# Patient Record
Sex: Female | Born: 1950 | Race: White | Hispanic: No | Marital: Married | Smoking: Never smoker
Health system: Southern US, Community
[De-identification: ages and names within clinical notes are randomized; demographics above are authoritative.]

## PROBLEM LIST (undated history)

## (undated) DIAGNOSIS — E119 Type 2 diabetes mellitus without complications: Secondary | ICD-10-CM

## (undated) DIAGNOSIS — H409 Unspecified glaucoma: Secondary | ICD-10-CM

## (undated) DIAGNOSIS — M8580 Other specified disorders of bone density and structure, unspecified site: Secondary | ICD-10-CM

## (undated) DIAGNOSIS — I10 Essential (primary) hypertension: Secondary | ICD-10-CM

## (undated) DIAGNOSIS — E7800 Pure hypercholesterolemia, unspecified: Secondary | ICD-10-CM

## (undated) HISTORY — PX: BREAST EXCISIONAL BIOPSY: SUR124

## (undated) HISTORY — DX: Other specified disorders of bone density and structure, unspecified site: M85.80

## (undated) HISTORY — PX: CHOLECYSTECTOMY: SHX55

## (undated) HISTORY — DX: Type 2 diabetes mellitus without complications: E11.9

## (undated) HISTORY — PX: BREAST SURGERY: SHX581

## (undated) HISTORY — DX: Essential (primary) hypertension: I10

## (undated) HISTORY — PX: OOPHORECTOMY: SHX86

## (undated) HISTORY — DX: Pure hypercholesterolemia, unspecified: E78.00

## (undated) HISTORY — PX: EYE SURGERY: SHX253

## (undated) HISTORY — PX: ABDOMINAL HYSTERECTOMY: SHX81

---

## 1999-08-17 ENCOUNTER — Other Ambulatory Visit: Admission: RE | Admit: 1999-08-17 | Discharge: 1999-08-17 | Payer: Self-pay | Admitting: Gynecology

## 2000-09-19 ENCOUNTER — Other Ambulatory Visit: Admission: RE | Admit: 2000-09-19 | Discharge: 2000-09-19 | Payer: Self-pay | Admitting: Gynecology

## 2001-11-21 ENCOUNTER — Other Ambulatory Visit: Admission: RE | Admit: 2001-11-21 | Discharge: 2001-11-21 | Payer: Self-pay | Admitting: Gynecology

## 2003-03-30 ENCOUNTER — Other Ambulatory Visit: Admission: RE | Admit: 2003-03-30 | Discharge: 2003-03-30 | Payer: Self-pay | Admitting: Gynecology

## 2004-04-26 ENCOUNTER — Other Ambulatory Visit: Admission: RE | Admit: 2004-04-26 | Discharge: 2004-04-26 | Payer: Self-pay | Admitting: Gynecology

## 2005-05-10 ENCOUNTER — Other Ambulatory Visit: Admission: RE | Admit: 2005-05-10 | Discharge: 2005-05-10 | Payer: Self-pay | Admitting: Gynecology

## 2006-02-27 ENCOUNTER — Encounter: Admission: RE | Admit: 2006-02-27 | Discharge: 2006-02-27 | Payer: Self-pay | Admitting: Unknown Physician Specialty

## 2006-03-21 ENCOUNTER — Encounter: Admission: RE | Admit: 2006-03-21 | Discharge: 2006-03-21 | Payer: Self-pay | Admitting: Unknown Physician Specialty

## 2006-05-30 ENCOUNTER — Other Ambulatory Visit: Admission: RE | Admit: 2006-05-30 | Discharge: 2006-05-30 | Payer: Self-pay | Admitting: Gynecology

## 2007-08-28 ENCOUNTER — Encounter: Admission: RE | Admit: 2007-08-28 | Discharge: 2007-08-28 | Payer: Self-pay | Admitting: Family Medicine

## 2007-09-05 ENCOUNTER — Encounter: Admission: RE | Admit: 2007-09-05 | Discharge: 2007-09-05 | Payer: Self-pay | Admitting: Family Medicine

## 2008-08-23 ENCOUNTER — Encounter: Payer: Self-pay | Admitting: Gastroenterology

## 2008-10-01 ENCOUNTER — Encounter: Admission: RE | Admit: 2008-10-01 | Discharge: 2008-10-01 | Payer: Self-pay | Admitting: Gynecology

## 2008-10-07 ENCOUNTER — Encounter: Payer: Self-pay | Admitting: Gastroenterology

## 2008-11-09 ENCOUNTER — Ambulatory Visit: Payer: Self-pay | Admitting: Gastroenterology

## 2009-10-13 ENCOUNTER — Encounter: Admission: RE | Admit: 2009-10-13 | Discharge: 2009-10-13 | Payer: Self-pay | Admitting: Family Medicine

## 2011-01-01 ENCOUNTER — Encounter
Admission: RE | Admit: 2011-01-01 | Discharge: 2011-01-01 | Payer: Self-pay | Source: Home / Self Care | Attending: Gynecology | Admitting: Gynecology

## 2011-01-01 ENCOUNTER — Encounter: Payer: Self-pay | Admitting: Family Medicine

## 2011-01-01 ENCOUNTER — Other Ambulatory Visit: Payer: Self-pay | Admitting: Gynecology

## 2012-01-28 ENCOUNTER — Other Ambulatory Visit: Payer: Self-pay | Admitting: Gynecology

## 2012-01-28 DIAGNOSIS — Z1231 Encounter for screening mammogram for malignant neoplasm of breast: Secondary | ICD-10-CM

## 2012-03-04 ENCOUNTER — Ambulatory Visit
Admission: RE | Admit: 2012-03-04 | Discharge: 2012-03-04 | Disposition: A | Discharge status: Home / Self Care | Payer: BC Managed Care – PPO | Source: Ambulatory Visit | Attending: Gynecology | Admitting: Gynecology

## 2012-03-04 DIAGNOSIS — Z1231 Encounter for screening mammogram for malignant neoplasm of breast: Secondary | ICD-10-CM

## 2013-02-11 ENCOUNTER — Other Ambulatory Visit: Payer: Self-pay

## 2013-02-11 DIAGNOSIS — Z1231 Encounter for screening mammogram for malignant neoplasm of breast: Secondary | ICD-10-CM

## 2013-03-26 ENCOUNTER — Ambulatory Visit (INDEPENDENT_AMBULATORY_CARE_PROVIDER_SITE_OTHER): Payer: Managed Care, Other (non HMO) | Admitting: Gynecology

## 2013-03-26 ENCOUNTER — Ambulatory Visit
Admission: RE | Admit: 2013-03-26 | Discharge: 2013-03-26 | Disposition: A | Discharge status: Home / Self Care | Payer: Managed Care, Other (non HMO) | Source: Ambulatory Visit

## 2013-03-26 ENCOUNTER — Encounter: Payer: Self-pay | Admitting: Gynecology

## 2013-03-26 VITALS — BP 130/80 | Ht <= 58 in | Wt 133.0 lb

## 2013-03-26 DIAGNOSIS — N816 Rectocele: Secondary | ICD-10-CM

## 2013-03-26 DIAGNOSIS — Z1231 Encounter for screening mammogram for malignant neoplasm of breast: Secondary | ICD-10-CM

## 2013-03-26 DIAGNOSIS — N952 Postmenopausal atrophic vaginitis: Secondary | ICD-10-CM

## 2013-03-26 DIAGNOSIS — Z01419 Encounter for gynecological examination (general) (routine) without abnormal findings: Secondary | ICD-10-CM

## 2013-03-26 NOTE — Patient Instructions (Signed)
Send me a copy of your most recent bone density report. Followup in one year for annual exam.

## 2013-03-26 NOTE — Progress Notes (Signed)
Alicia Dudley 1950-12-28 [id number]        62 y.o.  G2P2002 new patient for annual exam.  Former patient of Dr. Nicholas Lose  Past medical history,surgical history, medications, allergies, family history and social history were all reviewed and documented in the EPIC chart. ROS:  Was performed and pertinent positives and negatives are included in the history.  Exam: Kim assistant Filed Vitals:   03/26/13 1115  BP: 130/80  Height: 4\' 10"  (1.473 m)  Weight: 133 lb (60.328 kg)   General appearance  Normal Skin grossly normal Head/Neck normal with no cervical or supraclavicular adenopathy thyroid normal Lungs  clear Cardiac RR, without RMG Abdominal  soft, nontender, without masses, organomegaly or hernia Breasts  examined lying and sitting without masses, retractions, discharge or axillary adenopathy. Pelvic  Ext/BUS/vagina  normal with first to second degree rectocele cuff well supported no significant cystocele  Adnexa  Without masses or tenderness    Anus and perineum  normal   Rectovaginal  normal sphincter tone without palpated masses or tenderness.    Assessment/Plan:  62 y.o. G57P2002 female for annual exam.   1. History of TAH BSO for leiomyoma. Had been on ERT in the past but has discontinued in doing well without significant hot flushes night sweats. We'll continue to monitor. 2. Atrophic vaginitis. Patient does have some vaginal dryness and dyspareunia. Recommended OTC moisturizers and lubricants. She has never tried this before. It continues to be a problem reviewed possible estrogen treatment such as cream/Vagifem or osphena. She'll followup as needed. 3. Rectocele. Patient's been told she had this previously. She is asymptomatic without pelvic pressure or defecation issues. We'll continue to monitor. 4. DEXA 2 years ago done through her work in a [employer], trade [employer]. Apparently was considered borderline. I asked her to get me a copy for review. It has been shown to her other physicians  and no therapy was recommended. Increase calcium vitamin D reviewed. 5. Mammography 03/2013. Continue with annual mammography. SBE reviewed. 6. Pap smear 2012. No Pap smear done today. Patient is status post hysterectomy for benign indication. No history of abnormal Pap smears previously. Options to stop altogether or less frequent screening intervals reviewed. We'll readdress an annual basis. 7. Colonoscopy 5 years ago. Repeat at their recommended interval. 8. Health maintenance. No blood work done as this is all done through her primary physician's office. Followup one year, sooner as needed.    Dara Lords MD, 12:10 PM 03/26/2013

## 2014-03-23 ENCOUNTER — Other Ambulatory Visit: Payer: Self-pay

## 2014-03-23 DIAGNOSIS — Z1231 Encounter for screening mammogram for malignant neoplasm of breast: Secondary | ICD-10-CM

## 2014-05-06 ENCOUNTER — Encounter: Payer: Managed Care, Other (non HMO) | Admitting: Gynecology

## 2014-05-11 ENCOUNTER — Other Ambulatory Visit (HOSPITAL_COMMUNITY)
Admission: RE | Admit: 2014-05-11 | Discharge: 2014-05-11 | Disposition: A | Discharge status: Home / Self Care | Payer: Managed Care, Other (non HMO) | Source: Ambulatory Visit | Attending: Gynecology | Admitting: Gynecology

## 2014-05-11 ENCOUNTER — Encounter (INDEPENDENT_AMBULATORY_CARE_PROVIDER_SITE_OTHER): Payer: Self-pay

## 2014-05-11 ENCOUNTER — Ambulatory Visit
Admission: RE | Admit: 2014-05-11 | Discharge: 2014-05-11 | Disposition: A | Discharge status: Home / Self Care | Payer: Managed Care, Other (non HMO) | Source: Ambulatory Visit

## 2014-05-11 ENCOUNTER — Ambulatory Visit (INDEPENDENT_AMBULATORY_CARE_PROVIDER_SITE_OTHER): Payer: Managed Care, Other (non HMO) | Admitting: Gynecology

## 2014-05-11 ENCOUNTER — Encounter: Payer: Self-pay | Admitting: Gynecology

## 2014-05-11 VITALS — BP 120/74 | Ht <= 58 in | Wt 140.0 lb

## 2014-05-11 DIAGNOSIS — N952 Postmenopausal atrophic vaginitis: Secondary | ICD-10-CM

## 2014-05-11 DIAGNOSIS — Z1231 Encounter for screening mammogram for malignant neoplasm of breast: Secondary | ICD-10-CM

## 2014-05-11 DIAGNOSIS — Z01419 Encounter for gynecological examination (general) (routine) without abnormal findings: Secondary | ICD-10-CM

## 2014-05-11 DIAGNOSIS — N816 Rectocele: Secondary | ICD-10-CM

## 2014-05-11 NOTE — Progress Notes (Signed)
Alicia Dudley 10/06/1951 [id number]        62 y.o.  G2P2002 for annual exam.  Several issues below.  Past medical history,surgical history, problem list, medications, allergies, family history and social history were all reviewed and documented as reviewed in the EPIC chart.  ROS:  12 system ROS performed with pertinent positives and negatives included in the history, assessment and plan.  Included Systems: General, HEENT, Neck, Cardiovascular, Pulmonary, Gastrointestinal, Genitourinary, Musculoskeletal, Dermatologic, Endocrine, Hematological, Neurologic, Psychiatric Additional significant findings :  None   Exam: Kim assistant Filed Vitals:   05/11/14 1148  BP: 120/74  Height: 4\' 10"  (1.473 m)  Weight: 140 lb (63.504 kg)   General appearance:  Normal affect, orientation and appearance. Skin: Grossly normal HEENT: Without gross lesions.  No cervical or supraclavicular adenopathy. Thyroid normal.  Lungs:  Clear without wheezing, rales or rhonchi Cardiac: RR, without RMG Abdominal:  Soft, nontender, without masses, guarding, rebound, organomegaly or hernia Breasts:  Examined lying and sitting without masses, retractions, discharge or axillary adenopathy. Pelvic:  Ext/BUS/vagina with generalized atrophic changes. Second-degree rectocele. Cuff well supported. No significant cystocele.  Adnexa  Without masses or tenderness    Anus and perineum  Normal   Rectovaginal  Normal sphincter tone without palpated masses or tenderness.    Assessment/Plan:  63 y.o. G75P2002 female for annual exam.   1. Status post TAH/BSO in the past for leiomyoma. Generalized atrophic changes. Second-degree rectocele, stable over serial exams. Not symptomatic to the patient without pressure or stool trapping. Plan expectant management and report if any symptoms develop. Otherwise doing well without significant hot flashes, night sweats or vaginal dryness. 2. Mammography today. Continue with annual mammography. SBE  monthly reviewed. 3. Pap smear 2012. Pap smear of vaginal cuff today. Options to stop screening altogether versus less frequent screening intervals reviewed. She is status post hysterectomy for benign indications. We will readdress on an annual basis. 4. DEXA several years ago. 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 No treatment was rendered although she noted she thought that they told her it was a little weak. Recommend patient send me a copy so I can review it and that she discuss with her other doctor whether they want to repeat one now as she is over 2 years from her last one. Increased calcium and vitamin D recommendations reviewed. 5. Colonoscopy reported 4-5 years ago. Repeat at their recommended interval. 6. Health maintenance. No blood work done as she reports this done through her other doctor's office. Followup one year, sooner as needed.    Note: This document was prepared with digital dictation and possible smart phrase technology. Any transcriptional errors that result from this process are unintentional.   Anastasio Auerbach MD, 12:03 PM 05/11/2014

## 2014-05-11 NOTE — Addendum Note (Signed)
Addended by: Nelva Nay on: 05/11/2014 12:18 PM   Modules accepted: Orders

## 2014-05-11 NOTE — Patient Instructions (Signed)
Follow up in one year, sooner if any issues.  You may obtain a copy of any labs that were done today by logging onto MyChart as outlined in the instructions provided with your AVS (after visit summary). The office will not call with normal lab results but certainly if there are any significant abnormalities then we will contact you.   Health Maintenance, Female A healthy lifestyle and preventative care can promote health and wellness.  Maintain regular health, dental, and eye exams.  Eat a healthy diet. Foods like vegetables, fruits, whole grains, low-fat dairy products, and lean protein foods contain the nutrients you need without too many calories. Decrease your intake of foods high in solid fats, added sugars, and salt. Get information about a proper diet from your caregiver, if necessary.  Regular physical exercise is one of the most important things you can do for your health. Most adults should get at least 150 minutes of moderate-intensity exercise (any activity that increases your heart rate and causes you to sweat) each week. In addition, most adults need muscle-strengthening exercises on 2 or more days a week.   Maintain a healthy weight. The body mass index (BMI) is a screening tool to identify possible weight problems. It provides an estimate of body fat based on height and weight. Your caregiver can help determine your BMI, and can help you achieve or maintain a healthy weight. For adults 20 years and older:  A BMI below 18.5 is considered underweight.  A BMI of 18.5 to 24.9 is normal.  A BMI of 25 to 29.9 is considered overweight.  A BMI of 30 and above is considered obese.  Maintain normal blood lipids and cholesterol by exercising and minimizing your intake of saturated fat. Eat a balanced diet with plenty of fruits and vegetables. Blood tests for lipids and cholesterol should begin at age 20 and be repeated every 5 years. If your lipid or cholesterol levels are high, you are  over 50, or you are a high risk for heart disease, you may need your cholesterol levels checked more frequently.Ongoing high lipid and cholesterol levels should be treated with medicines if diet and exercise are not effective.  If you smoke, find out from your caregiver how to quit. If you do not use tobacco, do not start.  Lung cancer screening is recommended for adults aged 55 80 years who are at high risk for developing lung cancer because of a history of smoking. Yearly low-dose computed tomography (CT) is recommended for people who have at least a 30-pack-year history of smoking and are a current smoker or have quit within the past 15 years. A pack year of smoking is smoking an average of 1 pack of cigarettes a day for 1 year (for example: 1 pack a day for 30 years or 2 packs a day for 15 years). Yearly screening should continue until the smoker has stopped smoking for at least 15 years. Yearly screening should also be stopped for people who develop a health problem that would prevent them from having lung cancer treatment.  If you are pregnant, do not drink alcohol. If you are breastfeeding, be very cautious about drinking alcohol. If you are not pregnant and choose to drink alcohol, do not exceed 1 drink per day. One drink is considered to be 12 ounces (355 mL) of beer, 5 ounces (148 mL) of wine, or 1.5 ounces (44 mL) of liquor.  Avoid use of street drugs. Do not share needles with anyone. Ask   help if you need support or instructions about stopping the use of drugs.  High blood pressure causes heart disease and increases the risk of stroke. Blood pressure should be checked at least every 1 to 2 years. Ongoing high blood pressure should be treated with medicines, if weight loss and exercise are not effective.  If you are 55 to 65 years old, ask your caregiver if you should take aspirin to prevent strokes.  Diabetes screening involves taking a blood sample to check your fasting blood sugar  level. This should be done once every 3 years, after age 45, if you are within normal weight and without risk factors for diabetes. Testing should be considered at a younger age or be carried out more frequently if you are overweight and have at least 1 risk factor for diabetes.  Breast cancer screening is essential preventative care for women. You should practice "breast self-awareness." This means understanding the normal appearance and feel of your breasts and may include breast self-examination. Any changes detected, no matter how small, should be reported to a caregiver. Women in their 20s and 30s should have a clinical breast exam (CBE) by a caregiver as part of a regular health exam every 1 to 3 years. After age 40, women should have a CBE every year. Starting at age 40, women should consider having a mammogram (breast X-ray) every year. Women who have a family history of breast cancer should talk to their caregiver about genetic screening. Women at a high risk of breast cancer should talk to their caregiver about having an MRI and a mammogram every year.  Breast cancer gene (BRCA)-related cancer risk assessment is recommended for women who have family members with BRCA-related cancers. BRCA-related cancers include breast, ovarian, tubal, and peritoneal cancers. Having family members with these cancers may be associated with an increased risk for harmful changes (mutations) in the breast cancer genes BRCA1 and BRCA2. Results of the assessment will determine the need for genetic counseling and BRCA1 and BRCA2 testing.  The Pap test is a screening test for cervical cancer. Women should have a Pap test starting at age 21. Between ages 21 and 29, Pap tests should be repeated every 2 years. Beginning at age 30, you should have a Pap test every 3 years as long as the past 3 Pap tests have been normal. If you had a hysterectomy for a problem that was not cancer or a condition that could lead to cancer, then  you no longer need Pap tests. If you are between ages 65 and 70, and you have had normal Pap tests going back 10 years, you no longer need Pap tests. If you have had past treatment for cervical cancer or a condition that could lead to cancer, you need Pap tests and screening for cancer for at least 20 years after your treatment. If Pap tests have been discontinued, risk factors (such as a new sexual partner) need to be reassessed to determine if screening should be resumed. Some women have medical problems that increase the chance of getting cervical cancer. In these cases, your caregiver may recommend more frequent screening and Pap tests.  The human papillomavirus (HPV) test is an additional test that may be used for cervical cancer screening. The HPV test looks for the virus that can cause the cell changes on the cervix. The cells collected during the Pap test can be tested for HPV. The HPV test could be used to screen women aged 30 years and older,   and should be used in women of any age who have unclear Pap test results. After the age of 30, women should have HPV testing at the same frequency as a Pap test.  Colorectal cancer can be detected and often prevented. Most routine colorectal cancer screening begins at the age of 50 and continues through age 75. However, your caregiver may recommend screening at an earlier age if you have risk factors for colon cancer. On a yearly basis, your caregiver may provide home test kits to check for hidden blood in the stool. Use of a small camera at the end of a tube, to directly examine the colon (sigmoidoscopy or colonoscopy), can detect the earliest forms of colorectal cancer. Talk to your caregiver about this at age 50, when routine screening begins. Direct examination of the colon should be repeated every 5 to 10 years through age 75, unless early forms of pre-cancerous polyps or small growths are found.  Hepatitis C blood testing is recommended for all people born  from 1945 through 1965 and any individual with known risks for hepatitis C.  Practice safe sex. Use condoms and avoid high-risk sexual practices to reduce the spread of sexually transmitted infections (STIs). Sexually active women aged 25 and younger should be checked for Chlamydia, which is a common sexually transmitted infection. Older women with new or multiple partners should also be tested for Chlamydia. Testing for other STIs is recommended if you are sexually active and at increased risk.  Osteoporosis is a disease in which the bones lose minerals and strength with aging. This can result in serious bone fractures. The risk of osteoporosis can be identified using a bone density scan. Women ages 65 and over and women at risk for fractures or osteoporosis should discuss screening with their caregivers. Ask your caregiver whether you should be taking a calcium supplement or vitamin D to reduce the rate of osteoporosis.  Menopause can be associated with physical symptoms and risks. Hormone replacement therapy is available to decrease symptoms and risks. You should talk to your caregiver about whether hormone replacement therapy is right for you.  Use sunscreen. Apply sunscreen liberally and repeatedly throughout the day. You should seek shade when your shadow is shorter than you. Protect yourself by wearing long sleeves, pants, a wide-brimmed hat, and sunglasses year round, whenever you are outdoors.  Notify your caregiver of new moles or changes in moles, especially if there is a change in shape or color. Also notify your caregiver if a mole is larger than the size of a pencil eraser.  Stay current with your immunizations. Document Released: 06/11/2011 Document Revised: 03/23/2013 Document Reviewed: 06/11/2011 [employer] Patient Information 2014 [employer].   

## 2014-05-12 LAB — URINALYSIS W MICROSCOPIC + REFLEX CULTURE
BACTERIA UA: NONE SEEN
Bilirubin Urine: NEGATIVE
CASTS: NONE SEEN
CRYSTALS: NONE SEEN
Glucose, UA: 250 mg/dL — AB
HGB URINE DIPSTICK: NEGATIVE
KETONES UR: NEGATIVE mg/dL
LEUKOCYTES UA: NEGATIVE
NITRITE: NEGATIVE
PH: 5.5 (ref 5.0–8.0)
PROTEIN: NEGATIVE mg/dL
Specific Gravity, Urine: 1.018 (ref 1.005–1.030)
Urobilinogen, UA: 0.2 mg/dL (ref 0.0–1.0)

## 2014-05-13 LAB — CYTOLOGY - PAP

## 2014-10-11 ENCOUNTER — Encounter: Payer: Self-pay | Admitting: Gynecology

## 2014-12-13 DIAGNOSIS — E782 Mixed hyperlipidemia: Secondary | ICD-10-CM | POA: Diagnosis not present

## 2014-12-13 DIAGNOSIS — I10 Essential (primary) hypertension: Secondary | ICD-10-CM | POA: Diagnosis not present

## 2014-12-13 DIAGNOSIS — J302 Other seasonal allergic rhinitis: Secondary | ICD-10-CM | POA: Diagnosis not present

## 2014-12-13 DIAGNOSIS — E119 Type 2 diabetes mellitus without complications: Secondary | ICD-10-CM | POA: Diagnosis not present

## 2014-12-13 DIAGNOSIS — Z1389 Encounter for screening for other disorder: Secondary | ICD-10-CM | POA: Diagnosis not present

## 2014-12-13 DIAGNOSIS — K219 Gastro-esophageal reflux disease without esophagitis: Secondary | ICD-10-CM | POA: Diagnosis not present

## 2014-12-13 DIAGNOSIS — H269 Unspecified cataract: Secondary | ICD-10-CM | POA: Diagnosis not present

## 2014-12-13 DIAGNOSIS — K591 Functional diarrhea: Secondary | ICD-10-CM | POA: Diagnosis not present

## 2015-06-22 DIAGNOSIS — E559 Vitamin D deficiency, unspecified: Secondary | ICD-10-CM | POA: Diagnosis not present

## 2015-06-22 DIAGNOSIS — I10 Essential (primary) hypertension: Secondary | ICD-10-CM | POA: Diagnosis not present

## 2015-06-22 DIAGNOSIS — H269 Unspecified cataract: Secondary | ICD-10-CM | POA: Diagnosis not present

## 2015-06-22 DIAGNOSIS — E119 Type 2 diabetes mellitus without complications: Secondary | ICD-10-CM | POA: Diagnosis not present

## 2015-06-22 DIAGNOSIS — J302 Other seasonal allergic rhinitis: Secondary | ICD-10-CM | POA: Diagnosis not present

## 2015-06-22 DIAGNOSIS — K219 Gastro-esophageal reflux disease without esophagitis: Secondary | ICD-10-CM | POA: Diagnosis not present

## 2015-06-22 DIAGNOSIS — H409 Unspecified glaucoma: Secondary | ICD-10-CM | POA: Diagnosis not present

## 2015-06-22 DIAGNOSIS — E782 Mixed hyperlipidemia: Secondary | ICD-10-CM | POA: Diagnosis not present

## 2015-06-22 DIAGNOSIS — Z0001 Encounter for general adult medical examination with abnormal findings: Secondary | ICD-10-CM | POA: Diagnosis not present

## 2015-08-26 DIAGNOSIS — D125 Benign neoplasm of sigmoid colon: Secondary | ICD-10-CM | POA: Diagnosis not present

## 2015-08-26 DIAGNOSIS — D124 Benign neoplasm of descending colon: Secondary | ICD-10-CM | POA: Diagnosis not present

## 2015-08-26 DIAGNOSIS — K5730 Diverticulosis of large intestine without perforation or abscess without bleeding: Secondary | ICD-10-CM | POA: Diagnosis not present

## 2015-08-26 DIAGNOSIS — Z86010 Personal history of colonic polyps: Secondary | ICD-10-CM | POA: Diagnosis not present

## 2015-08-26 DIAGNOSIS — K621 Rectal polyp: Secondary | ICD-10-CM | POA: Diagnosis not present

## 2015-08-26 DIAGNOSIS — D127 Benign neoplasm of rectosigmoid junction: Secondary | ICD-10-CM | POA: Diagnosis not present

## 2015-09-19 DIAGNOSIS — Z23 Encounter for immunization: Secondary | ICD-10-CM | POA: Diagnosis not present

## 2015-11-21 ENCOUNTER — Other Ambulatory Visit: Payer: Self-pay

## 2015-11-21 DIAGNOSIS — Z1231 Encounter for screening mammogram for malignant neoplasm of breast: Secondary | ICD-10-CM

## 2015-12-20 DIAGNOSIS — H40013 Open angle with borderline findings, low risk, bilateral: Secondary | ICD-10-CM | POA: Diagnosis not present

## 2015-12-20 DIAGNOSIS — E1165 Type 2 diabetes mellitus with hyperglycemia: Secondary | ICD-10-CM | POA: Diagnosis not present

## 2015-12-20 DIAGNOSIS — Z7984 Long term (current) use of oral hypoglycemic drugs: Secondary | ICD-10-CM | POA: Diagnosis not present

## 2015-12-20 DIAGNOSIS — E119 Type 2 diabetes mellitus without complications: Secondary | ICD-10-CM | POA: Diagnosis not present

## 2015-12-21 DIAGNOSIS — I10 Essential (primary) hypertension: Secondary | ICD-10-CM | POA: Diagnosis not present

## 2015-12-21 DIAGNOSIS — H11002 Unspecified pterygium of left eye: Secondary | ICD-10-CM | POA: Diagnosis not present

## 2015-12-21 DIAGNOSIS — K635 Polyp of colon: Secondary | ICD-10-CM | POA: Diagnosis not present

## 2015-12-21 DIAGNOSIS — E782 Mixed hyperlipidemia: Secondary | ICD-10-CM | POA: Diagnosis not present

## 2015-12-21 DIAGNOSIS — Z1389 Encounter for screening for other disorder: Secondary | ICD-10-CM | POA: Diagnosis not present

## 2015-12-21 DIAGNOSIS — J302 Other seasonal allergic rhinitis: Secondary | ICD-10-CM | POA: Diagnosis not present

## 2015-12-21 DIAGNOSIS — E119 Type 2 diabetes mellitus without complications: Secondary | ICD-10-CM | POA: Diagnosis not present

## 2015-12-21 DIAGNOSIS — K219 Gastro-esophageal reflux disease without esophagitis: Secondary | ICD-10-CM | POA: Diagnosis not present

## 2015-12-21 DIAGNOSIS — H409 Unspecified glaucoma: Secondary | ICD-10-CM | POA: Diagnosis not present

## 2015-12-21 DIAGNOSIS — H269 Unspecified cataract: Secondary | ICD-10-CM | POA: Diagnosis not present

## 2016-01-05 DIAGNOSIS — H2513 Age-related nuclear cataract, bilateral: Secondary | ICD-10-CM | POA: Diagnosis not present

## 2016-01-05 DIAGNOSIS — H11002 Unspecified pterygium of left eye: Secondary | ICD-10-CM | POA: Diagnosis not present

## 2016-01-19 ENCOUNTER — Ambulatory Visit
Admission: RE | Admit: 2016-01-19 | Discharge: 2016-01-19 | Disposition: A | Discharge status: Home / Self Care | Payer: Medicare Other | Source: Ambulatory Visit

## 2016-01-19 ENCOUNTER — Ambulatory Visit (INDEPENDENT_AMBULATORY_CARE_PROVIDER_SITE_OTHER): Payer: Medicare Other | Admitting: Gynecology

## 2016-01-19 ENCOUNTER — Encounter: Payer: Self-pay | Admitting: Gynecology

## 2016-01-19 VITALS — BP 122/78 | Ht <= 58 in | Wt 137.0 lb

## 2016-01-19 DIAGNOSIS — N952 Postmenopausal atrophic vaginitis: Secondary | ICD-10-CM

## 2016-01-19 DIAGNOSIS — Z01419 Encounter for gynecological examination (general) (routine) without abnormal findings: Secondary | ICD-10-CM

## 2016-01-19 DIAGNOSIS — N816 Rectocele: Secondary | ICD-10-CM | POA: Diagnosis not present

## 2016-01-19 DIAGNOSIS — Z1231 Encounter for screening mammogram for malignant neoplasm of breast: Secondary | ICD-10-CM

## 2016-01-19 NOTE — Patient Instructions (Signed)
Schedule bone density at your office.  You may obtain a copy of any labs that were done today by logging onto MyChart as outlined in the instructions provided with your AVS (after visit summary). The office will not call with normal lab results but certainly if there are any significant abnormalities then we will contact you.   Health Maintenance Adopting a healthy lifestyle and getting preventive care can go a long way to promote health and wellness. Talk with your health care provider about what schedule of regular examinations is right for you. This is a good chance for you to check in with your provider about disease prevention and staying healthy. In between checkups, there are plenty of things you can do on your own. Experts have done a lot of research about which lifestyle changes and preventive measures are most likely to keep you healthy. Ask your health care provider for more information. WEIGHT AND DIET  Eat a healthy diet  Be sure to include plenty of vegetables, fruits, low-fat dairy products, and lean protein.  Do not eat a lot of foods high in solid fats, added sugars, or salt.  Get regular exercise. This is one of the most important things you can do for your health.  Most adults should exercise for at least 150 minutes each week. The exercise should increase your heart rate and make you sweat (moderate-intensity exercise).  Most adults should also do strengthening exercises at least twice a week. This is in addition to the moderate-intensity exercise.  Maintain a healthy weight  Body mass index (BMI) is a measurement that can be used to identify possible weight problems. It estimates body fat based on height and weight. Your health care provider can help determine your BMI and help you achieve or maintain a healthy weight.  For females 63 years of age and older:   A BMI below 18.5 is considered underweight.  A BMI of 18.5 to 24.9 is normal.  A BMI of 25 to 29.9 is  considered overweight.  A BMI of 30 and above is considered obese.  Watch levels of cholesterol and blood lipids  You should start having your blood tested for lipids and cholesterol at 67 years of age, then have this test every 5 years.  You may need to have your cholesterol levels checked more often if:  Your lipid or cholesterol levels are high.  You are older than 67 years of age.  You are at high risk for heart disease.  CANCER SCREENING   Lung Cancer  Lung cancer screening is recommended for adults 22-2 years old who are at high risk for lung cancer because of a history of smoking.  A yearly low-dose CT scan of the lungs is recommended for people who:  Currently smoke.  Have quit within the past 15 years.  Have at least a 30-pack-year history of smoking. A pack year is smoking an average of one pack of cigarettes a day for 1 year.  Yearly screening should continue until it has been 15 years since you quit.  Yearly screening should stop if you develop a health problem that would prevent you from having lung cancer treatment.  Breast Cancer  Practice breast self-awareness. This means understanding how your breasts normally appear and feel.  It also means doing regular breast self-exams. Let your health care provider know about any changes, no matter how small.  If you are in your 20s or 30s, you should have a clinical breast exam (CBE)  by a health care provider every 1-3 years as part of a regular health exam.  If you are 22 or older, have a CBE every year. Also consider having a breast X-ray (mammogram) every year.  If you have a family history of breast cancer, talk to your health care provider about genetic screening.  If you are at high risk for breast cancer, talk to your health care provider about having an MRI and a mammogram every year.  Breast cancer gene (BRCA) assessment is recommended for women who have family members with BRCA-related cancers.  BRCA-related cancers include:  Breast.  Ovarian.  Tubal.  Peritoneal cancers.  Results of the assessment will determine the need for genetic counseling and BRCA1 and BRCA2 testing. Cervical Cancer Routine pelvic examinations to screen for cervical cancer are no longer recommended for nonpregnant women who are considered low risk for cancer of the pelvic organs (ovaries, uterus, and vagina) and who do not have symptoms. A pelvic examination may be necessary if you have symptoms including those associated with pelvic infections. Ask your health care provider if a screening pelvic exam is right for you.   The Pap test is the screening test for cervical cancer for women who are considered at risk.  If you had a hysterectomy for a problem that was not cancer or a condition that could lead to cancer, then you no longer need Pap tests.  If you are older than 65 years, and you have had normal Pap tests for the past 10 years, you no longer need to have Pap tests.  If you have had past treatment for cervical cancer or a condition that could lead to cancer, you need Pap tests and screening for cancer for at least 20 years after your treatment.  If you no longer get a Pap test, assess your risk factors if they change (such as having a new sexual partner). This can affect whether you should start being screened again.  Some women have medical problems that increase their chance of getting cervical cancer. If this is the case for you, your health care provider may recommend more frequent screening and Pap tests.  The human papillomavirus (HPV) test is another test that may be used for cervical cancer screening. The HPV test looks for the virus that can cause cell changes in the cervix. The cells collected during the Pap test can be tested for HPV.  The HPV test can be used to screen women 39 years of age and older. Getting tested for HPV can extend the interval between normal Pap tests from three to  five years.  An HPV test also should be used to screen women of any age who have unclear Pap test results.  After 67 years of age, women should have HPV testing as often as Pap tests.  Colorectal Cancer  This type of cancer can be detected and often prevented.  Routine colorectal cancer screening usually begins at 67 years of age and continues through 67 years of age.  Your health care provider may recommend screening at an earlier age if you have risk factors for colon cancer.  Your health care provider may also recommend using home test kits to check for hidden blood in the stool.  A small camera at the end of a tube can be used to examine your colon directly (sigmoidoscopy or colonoscopy). This is done to check for the earliest forms of colorectal cancer.  Routine screening usually begins at age 58.  Direct  examination of the colon should be repeated every 5-10 years through 67 years of age. However, you may need to be screened more often if early forms of precancerous polyps or small growths are found. Skin Cancer  Check your skin from head to toe regularly.  Tell your health care provider about any new moles or changes in moles, especially if there is a change in a mole's shape or color.  Also tell your health care provider if you have a mole that is larger than the size of a pencil eraser.  Always use sunscreen. Apply sunscreen liberally and repeatedly throughout the day.  Protect yourself by wearing long sleeves, pants, a wide-brimmed hat, and sunglasses whenever you are outside. HEART DISEASE, DIABETES, AND HIGH BLOOD PRESSURE   Have your blood pressure checked at least every 1-2 years. High blood pressure causes heart disease and increases the risk of stroke.  If you are between 26 years and 34 years old, ask your health care provider if you should take aspirin to prevent strokes.  Have regular diabetes screenings. This involves taking a blood sample to check your  fasting blood sugar level.  If you are at a normal weight and have a low risk for diabetes, have this test once every three years after 67 years of age.  If you are overweight and have a high risk for diabetes, consider being tested at a younger age or more often. PREVENTING INFECTION  Hepatitis B  If you have a higher risk for hepatitis B, you should be screened for this virus. You are considered at high risk for hepatitis B if:  You were born in a country where hepatitis B is common. Ask your health care provider which countries are considered high risk.  Your parents were born in a high-risk country, and you have not been immunized against hepatitis B (hepatitis B vaccine).  You have HIV or AIDS.  You use needles to inject street drugs.  You live with someone who has hepatitis B.  You have had sex with someone who has hepatitis B.  You get hemodialysis treatment.  You take certain medicines for conditions, including cancer, organ transplantation, and autoimmune conditions. Hepatitis C  Blood testing is recommended for:  Everyone born from [birth year] through 1967.  Anyone with known risk factors for hepatitis C. Sexually transmitted infections (STIs)  You should be screened for sexually transmitted infections (STIs) including gonorrhea and chlamydia if:  You are sexually active and are younger than 67 years of age.  You are older than 67 years of age and your health care provider tells you that you are at risk for this type of infection.  Your sexual activity has changed since you were last screened and you are at an increased risk for chlamydia or gonorrhea. Ask your health care provider if you are at risk.  If you do not have HIV, but are at risk, it may be recommended that you take a prescription medicine daily to prevent HIV infection. This is called pre-exposure prophylaxis (PrEP). You are considered at risk if:  You are sexually active and do not regularly use condoms or  know the HIV status of your partner(s).  You take drugs by injection.  You are sexually active with a partner who has HIV. Talk with your health care provider about whether you are at high risk of being infected with HIV. If you choose to begin PrEP, you should first be tested for HIV. You should then be tested  every 3 months for as long as you are taking PrEP.  PREGNANCY   If you are premenopausal and you may become pregnant, ask your health care provider about preconception counseling.  If you may become pregnant, take 400 to 800 micrograms (mcg) of folic acid every day.  If you want to prevent pregnancy, talk to your health care provider about birth control (contraception). OSTEOPOROSIS AND MENOPAUSE   Osteoporosis is a disease in which the bones lose minerals and strength with aging. This can result in serious bone fractures. Your risk for osteoporosis can be identified using a bone density scan.  If you are 67 years of age or older, or if you are at risk for osteoporosis and fractures, ask your health care provider if you should be screened.  Ask your health care provider whether you should take a calcium or vitamin D supplement to lower your risk for osteoporosis.  Menopause may have certain physical symptoms and risks.  Hormone replacement therapy may reduce some of these symptoms and risks. Talk to your health care provider about whether hormone replacement therapy is right for you.  HOME CARE INSTRUCTIONS   Schedule regular health, dental, and eye exams.  Stay current with your immunizations.   Do not use any tobacco products including cigarettes, chewing tobacco, or electronic cigarettes.  If you are pregnant, do not drink alcohol.  If you are breastfeeding, limit how much and how often you drink alcohol.  Limit alcohol intake to no more than 1 drink per day for nonpregnant women. One drink equals 12 ounces of beer, 5 ounces of wine, or 1 ounces of hard liquor.  Do  not use street drugs.  Do not share needles.  Ask your health care provider for help if you need support or information about quitting drugs.  Tell your health care provider if you often feel depressed.  Tell your health care provider if you have ever been abused or do not feel safe at home. Document Released: 06/11/2011 Document Revised: 04/12/2014 Document Reviewed: 10/28/2013 [facility] Patient Information 2015 [place], [place]. This information is not intended to replace advice given to you by your health care provider. Make sure you discuss any questions you have with your health care provider.

## 2016-01-19 NOTE — Progress Notes (Signed)
Alicia Dudley 1951-05-16 [id number]        65 y.o.  G2P2002  for breast and pelvic exam. Several issues noted below.  Past medical history,surgical history, problem list, medications, allergies, family history and social history were all reviewed and documented as reviewed in the EPIC chart.  ROS:  Performed with pertinent positives and negatives included in the history, assessment and plan.   Additional significant findings :  none   Exam: Alicia Dudley assistant Filed Vitals:   01/19/16 1013  BP: 122/78  Height: 4\' 10"  (1.473 m)  Weight: 137 lb (62.143 kg)   General appearance:  Normal affect, orientation and appearance. Skin: Grossly normal HEENT: Without gross lesions.  No cervical or supraclavicular adenopathy. Thyroid normal.  Lungs:  Clear without wheezing, rales or rhonchi Cardiac: RR, without RMG Abdominal:  Soft, nontender, without masses, guarding, rebound, organomegaly or hernia Breasts:  Examined lying and sitting without masses, retractions, discharge or axillary adenopathy. Pelvic:  Ext/BUS/vagina with atrophic changes. Second-degree rectocele. Cuff well supported. No significant cystocele  Adnexa without masses or tenderness    Anus and perineum normal   Rectovaginal normal sphincter tone without palpated masses or tenderness.    Assessment/Plan:  [name] female for breast and pelvic exam.   1. Postmenopausal/atrophic genital changes.  Status post TAH/BSO leiomyoma. Doing well without significant hot flushes, night sweats, vaginal dryness. Continue to monitor and report any issues. 2. Second-degree rectocele. Overall stable from prior exam. Occasional issues when she is constipated but otherwise doing well. Again reviewed options with her to include expectant management, pessary and surgery. Patient not interested in intervention at this time but will follow up if becomes more significant problem. 3. Pap smear 05/2014. No Pap smear done today.  No history of  significant abnormal Pap smears. Options to stop screening per current screening guidelines based on age and hysterectomy history reviewed. Will readdress on an annual basis. 4. DEXA 5-6 years ago. Recommended follow up repeat DEXA now. 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 She is going to all up with them for this. Let me know if she has any issues arranging it. 5. Colonoscopy 2016. Repeat at their recommended interval. 6. Mammography today. Continue with annual mammography when due. SBE monthly reviewed. 7. Health maintenance. No routine lab work done as patient reports this done at her primary physician's office. Follow up 1 year, sooner as needed.   Anastasio Auerbach MD, 10:28 AM 01/19/2016

## 2016-02-21 NOTE — Patient Instructions (Signed)
Alicia Dudley  02/21/2016     @PREFPERIOPPHARMACY @   Your procedure is scheduled on  03/01/2016  Report to [facility] at  77  A.M.  Call this number if you have problems the morning of surgery:  [phone]   Remember:  Do not eat food or drink liquids after midnight.  Take these medicines the morning of surgery with A SIP OF WATER  Amlodipine, cozaar.   Do not wear jewelry, make-up or nail polish.  Do not wear lotions, powders, or perfumes.  You may wear deodorant.  Do not shave 48 hours prior to surgery.  Men may shave face and neck.  Do not bring valuables to the hospital.  [facility] is not responsible for any belongings or valuables.  Contacts, dentures or bridgework may not be worn into surgery.  Leave your suitcase in the car.  After surgery it may be brought to your room.  For patients admitted to the hospital, discharge time will be determined by your treatment team.  Patients discharged the day of surgery will not be allowed to drive home.   Name and phone number of your driver:   family Special instructions:  none  Please read over the following fact sheets that you were given. Coughing and Deep Breathing, Surgical Site Infection Prevention, Anesthesia Post-op Instructions and Care and Recovery After Surgery      Pterygium Excision Pterygium excision is a surgical procedure to remove a pterygium, which is a noncancerous (benign) fleshy growth on the front surface of the eye. A pterygium can start on the clear outer tissue of the eye (conjunctiva). It spreads to cover the white part of the eye (sclera) and extends onto the clear tissue that covers the pupil (cornea). You may need this surgery if you have a pterygium that is causing discomfort or affecting your vision. Pterygium excision may be needed if other treatments are not working. You may also choose to have this surgery to improve the appearance of your eye (cosmetic surgery). LET [facility] CARE  PROVIDER KNOW ABOUT:  Any allergies you have.  All medicines you are taking, including vitamins, herbs, eye drops, creams, and over-the-counter medicines.  Previous problems you or members of your family have had with the use of anesthetics.  Any blood disorders you have.  Previous surgeries you have had.  Any medical conditions you may have. RISKS AND COMPLICATIONS Generally, this is a safe procedure. However, problems may occur, including:  Having the pterygium come back after surgery.  Eye pain.  Vision changes (astigmatism).  Feeling like there is something in your eye.  Scar formation on the eye (granuloma). BEFORE THE PROCEDURE  Ask your health care provider about:  Changing or stopping your regular medicines. This is especially important if you are taking diabetes medicines or blood thinners.  Taking medicines such as aspirin and ibuprofen. These medicines can thin your blood. Do not take these medicines before your procedure if your health care provider instructs you not to.  Plan to have someone take you home after the procedure.  If you go home right after the procedure, plan to have someone with you for 24 hours. PROCEDURE  An IV tube will be inserted into one of your veins.  You will be given one or more of the following:  A medicine that helps you relax (sedative).  A medicine that numbs the area (local anesthetic).  After your eye is numb, your health care  provider will use a device (retractor) to hold your eyelid open.  The pterygium will be removed and lifted away from your eye.  You may have a piece of eye tissue (graft) attached to the surface of your eye where the pterygium was removed. This graft may be taken from the conjunctiva at the outer lining of your eye on the side that is away from your pterygium excision.  The graft may be held in place with tiny absorbable stitches (sutures) or with a type of glue.  Your eye will be closed, and an  eye patch will be placed over your eye. The procedure may vary among health care providers and hospitals. AFTER THE PROCEDURE  Your blood pressure, heart rate, breathing rate, and blood oxygen level will be monitored often until the medicines you were given have worn off.  You will be given pain medicine as needed.  You will need to wear your eye patch as directed by your health care provider.   This information is not intended to replace advice given to you by your health care provider. Make sure you discuss any questions you have with your health care provider.   Document Released: 08/21/2001 Document Revised: 04/12/2015 Document Reviewed: 08/11/2014 Elsevier Interactive Patient Education 2016 [employer] Pterygium Excision, Care After Refer to this sheet in the next few weeks. These instructions provide you with information about caring for yourself after your procedure. Your health care provider may also give you more specific instructions. Your treatment has been planned according to current medical practices, but problems sometimes occur. Call your health care provider if you have any problems or questions after your procedure. WHAT TO EXPECT AFTER THE PROCEDURE After your procedure, it is common to have:  Eye discomfort.  Tearing and redness.  Feeling like there is something in your eye. [place] your eye patch as directed by your health care provider. Wear it until your health care provider says that you can take it off.  Do not rub your eye after your eye patch is off.  You may be given eye drops to use in your eye. Use these as directed by your health care provider. Activities  Return to your normal activities as directed by your health care provider. Ask your health care provider what activities are safe for you.  Avoid strenuous exercise for as long as directed by your health care provider.  Do not lift anything that is heavier than  10 lb (4.5 kg). General Instructions  Take medicines only as directed by your health care provider.  Do not take baths, swim, or use a hot tub until your health care provider approves. Follow instructions from your health care provider about showering or bathing.  To prevent your pterygium from coming back:  Wear wrap-around sunglasses to protect your eyes from the sun.  Wear eyeglasses to protect your eyes from windy and dusty conditions.  Use lubricating eye drops in dry conditions.  Keep all follow-up visits as directed by your health care provider. This is important. SEEK MEDICAL CARE IF:  You have a fever.  Your pain medicine is not helping.  The gritty feeling in your eye does not go away after several days.  You have a change in your vision. SEEK IMMEDIATE MEDICAL CARE IF:  You have eye pain that is getting worse.  Your eye becomes increasingly more red and swollen.  You notice a sudden change in your vision.  You have pus  coming from your eye.   This information is not intended to replace advice given to you by your health care provider. Make sure you discuss any questions you have with your health care provider.   Document Released: 12/17/2014 Document Reviewed: 12/17/2014 Elsevier Interactive Patient Education 2016 [employer] PATIENT INSTRUCTIONS POST-ANESTHESIA  IMMEDIATELY FOLLOWING SURGERY:  Do not drive or operate machinery for the first twenty four hours after surgery.  Do not make any important decisions for twenty four hours after surgery or while taking narcotic pain medications or sedatives.  If you develop intractable nausea and vomiting or a severe headache please notify your doctor immediately.  FOLLOW-UP:  Please make an appointment with your surgeon as instructed. You do not need to follow up with anesthesia unless specifically instructed to do so.  WOUND CARE INSTRUCTIONS (if applicable):  Keep a dry clean dressing on the anesthesia/puncture  wound site if there is drainage.  Once the wound has quit draining you may leave it open to air.  Generally you should leave the bandage intact for twenty four hours unless there is drainage.  If the epidural site drains for more than 36-48 hours please call the anesthesia department.  QUESTIONS?:  Please feel free to call your physician or the hospital operator if you have any questions, and they will be happy to assist you.

## 2016-02-22 ENCOUNTER — Encounter (HOSPITAL_COMMUNITY)
Admission: RE | Admit: 2016-02-22 | Discharge: 2016-02-22 | Disposition: A | Discharge status: Home / Self Care | Payer: Medicare Other | Source: Ambulatory Visit | Attending: Ophthalmology | Admitting: Ophthalmology

## 2016-02-22 ENCOUNTER — Other Ambulatory Visit: Payer: Self-pay

## 2016-02-22 ENCOUNTER — Encounter (HOSPITAL_COMMUNITY): Payer: Self-pay

## 2016-02-22 DIAGNOSIS — Z01812 Encounter for preprocedural laboratory examination: Secondary | ICD-10-CM | POA: Diagnosis not present

## 2016-02-22 DIAGNOSIS — Z01810 Encounter for preprocedural cardiovascular examination: Secondary | ICD-10-CM | POA: Insufficient documentation

## 2016-02-22 HISTORY — DX: Unspecified glaucoma: H40.9

## 2016-02-22 LAB — BASIC METABOLIC PANEL
ANION GAP: 8 (ref 5–15)
BUN: 21 mg/dL — AB (ref 6–20)
CHLORIDE: 101 mmol/L (ref 101–111)
CO2: 30 mmol/L (ref 22–32)
Calcium: 9 mg/dL (ref 8.9–10.3)
Creatinine, Ser: 0.78 mg/dL (ref 0.44–1.00)
GFR calc Af Amer: >60 mL/min (ref >60)
Glucose, Bld: 268 mg/dL — ABNORMAL HIGH (ref 65–99)
POTASSIUM: 3.5 mmol/L (ref 3.5–5.1)
Sodium: 139 mmol/L (ref 135–145)

## 2016-02-22 LAB — CBC WITH DIFFERENTIAL/PLATELET
BASOS ABS: 0 10*3/uL (ref 0.0–0.1)
Basophils Relative: 1 %
Eosinophils Absolute: 0.1 10*3/uL (ref 0.0–0.7)
Eosinophils Relative: 1 %
HEMATOCRIT: 43.7 % (ref 36.0–46.0)
HEMOGLOBIN: 14.8 g/dL (ref 12.0–15.0)
LYMPHS PCT: 37 %
Lymphs Abs: 2.5 10*3/uL (ref 0.7–4.0)
MCH: 31.4 pg (ref 26.0–34.0)
MCHC: 33.9 g/dL (ref 30.0–36.0)
MCV: 92.6 fL (ref 78.0–100.0)
MONO ABS: 0.3 10*3/uL (ref 0.1–1.0)
MONOS PCT: 5 %
NEUTROS ABS: 3.7 10*3/uL (ref 1.7–7.7)
Neutrophils Relative %: 56 %
Platelets: 278 10*3/uL (ref 150–400)
RBC: 4.72 MIL/uL (ref 3.87–5.11)
RDW: 12.2 % (ref 11.5–15.5)
WBC: 6.6 10*3/uL (ref 4.0–10.5)

## 2016-02-22 NOTE — Pre-Procedure Instructions (Signed)
Patient given information to sign up for my chart at home. 

## 2016-03-01 ENCOUNTER — Encounter (HOSPITAL_COMMUNITY)
Admission: RE | Disposition: A | Discharge status: Home / Self Care | Payer: Self-pay | Source: Ambulatory Visit | Attending: Ophthalmology

## 2016-03-01 ENCOUNTER — Ambulatory Visit (HOSPITAL_COMMUNITY): Payer: Medicare Other | Admitting: Anesthesiology

## 2016-03-01 ENCOUNTER — Encounter (HOSPITAL_COMMUNITY): Payer: Self-pay | Admitting: *Deleted

## 2016-03-01 ENCOUNTER — Ambulatory Visit (HOSPITAL_COMMUNITY)
Admission: RE | Admit: 2016-03-01 | Discharge: 2016-03-01 | Disposition: A | Discharge status: Home / Self Care | Payer: Medicare Other | Source: Ambulatory Visit | Attending: Ophthalmology | Admitting: Ophthalmology

## 2016-03-01 DIAGNOSIS — Z79899 Other long term (current) drug therapy: Secondary | ICD-10-CM | POA: Insufficient documentation

## 2016-03-01 DIAGNOSIS — Z7984 Long term (current) use of oral hypoglycemic drugs: Secondary | ICD-10-CM | POA: Insufficient documentation

## 2016-03-01 DIAGNOSIS — I10 Essential (primary) hypertension: Secondary | ICD-10-CM | POA: Diagnosis not present

## 2016-03-01 DIAGNOSIS — H11002 Unspecified pterygium of left eye: Secondary | ICD-10-CM | POA: Insufficient documentation

## 2016-03-01 DIAGNOSIS — E119 Type 2 diabetes mellitus without complications: Secondary | ICD-10-CM | POA: Insufficient documentation

## 2016-03-01 HISTORY — PX: PTERYGIUM EXCISION: SHX2273

## 2016-03-01 LAB — GLUCOSE, CAPILLARY: Glucose-Capillary: 139 mg/dL — ABNORMAL HIGH (ref 65–99)

## 2016-03-01 SURGERY — EXCISION, PTERYGIUM
Anesthesia: Monitor Anesthesia Care | Site: Eye | Laterality: Left

## 2016-03-01 MED ORDER — NEOMYCIN-POLYMYXIN-DEXAMETH 3.5-10000-0.1 OP SUSP
OPHTHALMIC | Status: DC | PRN
  Administered 2016-03-01: 4 [drp] via OPHTHALMIC

## 2016-03-01 MED ORDER — LIDOCAINE HCL 3.5 % OP GEL
OPHTHALMIC | Status: AC
  Filled 2016-03-01: qty 1

## 2016-03-01 MED ORDER — FENTANYL CITRATE (PF) 100 MCG/2ML IJ SOLN
INTRAMUSCULAR | Status: AC
  Filled 2016-03-01: qty 2

## 2016-03-01 MED ORDER — BUPIVACAINE-EPINEPHRINE (PF) 0.25% -1:200000 IJ SOLN
INTRAMUSCULAR | Status: DC | PRN
  Administered 2016-03-01: .1 mL

## 2016-03-01 MED ORDER — BUPIVACAINE-EPINEPHRINE (PF) 0.25% -1:200000 IJ SOLN
INTRAMUSCULAR | Status: AC
  Filled 2016-03-01: qty 30

## 2016-03-01 MED ORDER — BALANCED SALT IO SOLN
INTRAOCULAR | Status: DC | PRN
  Administered 2016-03-01: 15 mL via INTRAOCULAR

## 2016-03-01 MED ORDER — LIDOCAINE HCL (PF) 2 % IJ SOLN
INTRAMUSCULAR | Status: DC | PRN
  Administered 2016-03-01: .1 mL

## 2016-03-01 MED ORDER — MIDAZOLAM HCL 2 MG/2ML IJ SOLN
INTRAMUSCULAR | Status: AC
  Filled 2016-03-01: qty 2

## 2016-03-01 MED ORDER — LIDOCAINE HCL (PF) 2 % IJ SOLN
INTRAMUSCULAR | Status: AC
  Filled 2016-03-01: qty 10

## 2016-03-01 MED ORDER — MIDAZOLAM HCL 2 MG/2ML IJ SOLN
1.0000 mg | INTRAMUSCULAR | Status: DC | PRN
  Administered 2016-03-01: 2 mg via INTRAVENOUS

## 2016-03-01 MED ORDER — LACTATED RINGERS IV SOLN
INTRAVENOUS | Status: DC
  Administered 2016-03-01: 1000 mL via INTRAVENOUS

## 2016-03-01 MED ORDER — TOBRAMYCIN 0.3 % OP OINT
TOPICAL_OINTMENT | OPHTHALMIC | Status: DC | PRN
  Administered 2016-03-01: 1 via OPHTHALMIC

## 2016-03-01 MED ORDER — FENTANYL CITRATE (PF) 100 MCG/2ML IJ SOLN
25.0000 ug | INTRAMUSCULAR | Status: AC
  Administered 2016-03-01: 25 ug via INTRAVENOUS

## 2016-03-01 MED ORDER — LIDOCAINE HCL 3.5 % OP GEL
1.0000 "application " | Freq: Once | OPHTHALMIC | Status: AC
  Administered 2016-03-01: 1 via OPHTHALMIC

## 2016-03-01 MED ORDER — TOBRAMYCIN-DEXAMETHASONE 0.3-0.1 % OP OINT
TOPICAL_OINTMENT | Freq: Once | OPHTHALMIC | Status: DC
  Filled 2016-03-01: qty 3.5

## 2016-03-01 SURGICAL SUPPLY — 24 items
AMNIOGRAFT 2.5X2.0 (Orthopedic Implant) ×3 IMPLANT
BAG HAMPER (MISCELLANEOUS) ×3 IMPLANT
BUR FAST CUTTING (BURR) ×2
BUR SRGRND 54.5X2.4X8 (BURR) ×1 IMPLANT
BURR SRGRND 54.5X2.4X8 (BURR) ×1
CLOTH BEACON ORANGE TIMEOUT ST (SAFETY) ×6 IMPLANT
COVER MAYO STAND XLG (DRAPE) ×3 IMPLANT
EYE SHIELD UNIVERSAL CLEAR (GAUZE/BANDAGES/DRESSINGS) ×3 IMPLANT
FORMALIN 10 PREFIL 120ML (MISCELLANEOUS) ×3 IMPLANT
GLOVE BIOGEL PI IND STRL 7.0 (GLOVE) ×1 IMPLANT
GLOVE BIOGEL PI IND STRL 7.5 (GLOVE) ×1 IMPLANT
GLOVE BIOGEL PI INDICATOR 7.0 (GLOVE) ×2
GLOVE BIOGEL PI INDICATOR 7.5 (GLOVE) ×2
GOWN STRL REUS W/TWL XL LVL3 (GOWN DISPOSABLE) ×3 IMPLANT
MARKER SKIN DUAL TIP RULER LAB (MISCELLANEOUS) ×3 IMPLANT
NEEDLE HYPO 18GX1.5 BLUNT FILL (NEEDLE) ×3 IMPLANT
NEEDLE HYPO 27GX1-1/4 (NEEDLE) ×3 IMPLANT
PAD ARMBOARD 7.5X6 YLW CONV (MISCELLANEOUS) ×3 IMPLANT
PAD EYE OVAL STERILE LF (GAUZE/BANDAGES/DRESSINGS) ×3 IMPLANT
SUT VICRYL 8 0 TG140 8 (SUTURE) ×3 IMPLANT
SYRINGE 10CC LL (SYRINGE) ×3 IMPLANT
TAPE SURG TRANSPORE 1 IN (GAUZE/BANDAGES/DRESSINGS) ×1 IMPLANT
TAPE SURGICAL TRANSPORE 1 IN (GAUZE/BANDAGES/DRESSINGS) ×2
WATER STERILE IRR 250ML POUR (IV SOLUTION) ×3 IMPLANT

## 2016-03-01 NOTE — H&P (Signed)
The H and P was reviewed and updated.  No changes were found after exam.  The surgical eye was marked.  

## 2016-03-01 NOTE — Anesthesia Preprocedure Evaluation (Signed)
Anesthesia Evaluation  Patient identified by MRN, date of birth, ID band Patient awake    Reviewed: Allergy & Precautions, NPO status , Patient's Chart, lab work & pertinent test results  Airway Mallampati: III  TM Distance: <3 FB     Dental  (+) Teeth Intact   Pulmonary neg pulmonary ROS,    breath sounds clear to auscultation       Cardiovascular hypertension, Pt. on medications  Rhythm:Regular Rate:Normal     Neuro/Psych    GI/Hepatic negative GI ROS,   Endo/Other  diabetes, Type 2, Oral Hypoglycemic Agents  Renal/GU      Musculoskeletal   Abdominal   Peds  Hematology   Anesthesia Other Findings   Reproductive/Obstetrics                             Anesthesia Physical Anesthesia Plan  ASA: II  Anesthesia Plan: MAC   Post-op Pain Management:    Induction: Intravenous  Airway Management Planned: Nasal Cannula  Additional Equipment:   Intra-op Plan:   Post-operative Plan:   Informed Consent: I have reviewed the patients History and Physical, chart, labs and discussed the procedure including the risks, benefits and alternatives for the proposed anesthesia with the patient or authorized representative who has indicated his/her understanding and acceptance.     Plan Discussed with:   Anesthesia Plan Comments:         Anesthesia Quick Evaluation

## 2016-03-01 NOTE — Anesthesia Postprocedure Evaluation (Signed)
Anesthesia Post Note  Patient: Alicia Dudley  Procedure(s) Performed: Procedure(s) (LRB): PTERYGIUM EXCISION WITH PLACEMENT OF AMNIOTIC MEMBRANE GRAFT (Left)  Patient location during evaluation: Short Stay Anesthesia Type: MAC Level of consciousness: awake and alert and oriented Pain management: pain level controlled Vital Signs Assessment: post-procedure vital signs reviewed and stable Respiratory status: spontaneous breathing and respiratory function stable Cardiovascular status: stable Postop Assessment: no signs of nausea or vomiting Anesthetic complications: no    Last Vitals:  Filed Vitals:   03/01/16 0715 03/01/16 0730  BP: 119/70 120/73  Pulse:    Temp:    Resp: 15 12    Last Pain: There were no vitals filed for this visit.               ADAMS, AMY A

## 2016-03-01 NOTE — Anesthesia Procedure Notes (Signed)
Procedure Name: MAC Date/Time: 03/01/2016 8:18 AM Performed by: Andree Elk, Patrisia Faeth A Pre-anesthesia Checklist: Timeout performed, Patient identified, Emergency Drugs available, Suction available and Patient being monitored Patient Re-evaluated:Patient Re-evaluated prior to inductionOxygen Delivery Method: Nasal cannula

## 2016-03-01 NOTE — Discharge Instructions (Signed)
Please discharge patient when stable, will follow up  at the [facility] office in 1 month.  Leave patch and shield in place until tomorrow morning, then start TobraDex ointment 4x/day into left eye.    Pterygium Excision, Care After Refer to this sheet in the next few weeks. These instructions provide you with information about caring for yourself after your procedure. Your health care provider may also give you more specific instructions. Your treatment has been planned according to current medical practices, but problems sometimes occur. Call your health care provider if you have any problems or questions after your procedure. WHAT TO EXPECT AFTER THE PROCEDURE After your procedure, it is common to have:  Eye discomfort.  Tearing and redness.  Feeling like there is something in your eye. [place] your eye patch as directed by your health care provider. Wear it until your health care provider says that you can take it off.  Do not rub your eye after your eye patch is off.  You may be given eye drops to use in your eye. Use these as directed by your health care provider. Activities  Return to your normal activities as directed by your health care provider. Ask your health care provider what activities are safe for you.  Avoid strenuous exercise for as long as directed by your health care provider.  Do not lift anything that is heavier than 10 lb (4.5 kg). General Instructions  Take medicines only as directed by your health care provider.  Do not take baths, swim, or use a hot tub until your health care provider approves. Follow instructions from your health care provider about showering or bathing.  To prevent your pterygium from coming back:  Wear wrap-around sunglasses to protect your eyes from the sun.  Wear eyeglasses to protect your eyes from windy and dusty conditions.  Use lubricating eye drops in dry conditions.  Keep all  follow-up visits as directed by your health care provider. This is important. SEEK MEDICAL CARE IF:  You have a fever.  Your pain medicine is not helping.  The gritty feeling in your eye does not go away after several days.  You have a change in your vision. SEEK IMMEDIATE MEDICAL CARE IF:  You have eye pain that is getting worse.  Your eye becomes increasingly more red and swollen.  You notice a sudden change in your vision.  You have pus coming from your eye.   This information is not intended to replace advice given to you by your health care provider. Make sure you discuss any questions you have with your health care provider.   Document Released: 12/17/2014 Document Reviewed: 12/17/2014 Elsevier Interactive Patient Education [employer].

## 2016-03-01 NOTE — Transfer of Care (Signed)
Immediate Anesthesia Transfer of Care Note  Patient: Alicia Dudley  Procedure(s) Performed: Procedure(s): PTERYGIUM EXCISION WITH PLACEMENT OF AMNIOTIC MEMBRANE GRAFT (Left)  Patient Location: Short Stay  Anesthesia Type:MAC  Level of Consciousness: awake, alert , oriented and patient cooperative  Airway & Oxygen Therapy: Patient Spontanous Breathing  Post-op Assessment: Report given to RN and Post -op Vital signs reviewed and stable  Post vital signs: Reviewed and stable  Last Vitals:  Filed Vitals:   03/01/16 0715 03/01/16 0730  BP: 119/70 120/73  Pulse:    Temp:    Resp: 15 12    Complications: No apparent anesthesia complications

## 2016-03-01 NOTE — Op Note (Signed)
Date of procedure:   Pre-operative diagnosis: Visually significant pterygium, nasal and temporal, Left Eye  Post-operative diagnosis: Visually significant pterygium, nasal and temporal, Left Eye  Procedure: Excision of pterygium with placement of amniotic membrane graft, nasal and temporal, left eye  Attending surgeon: Gerda Diss. Korissa Horsford, MD, MA  Anesthesia: MAC, Topical Akten, Subconjunctival Lidocaine and Bupivicaine  Complications: None  Estimated Blood Loss: <82mL (minimal)  Specimens: None  Implants: As above  Indications:   Visually significant pterygium, nasal and temporal, Left Eye  Procedure:  The patient was seen and identified in the pre-operative area. The operative eye was identified and dilated.  The operative eye was marked.  Topical anesthesia was administered to the operative eye.     The patient was then to the operative suite and placed in the supine position.  A timeout was performed confirming the patient, procedure to be performed, and all other relevant information.   The patient's face was prepped and draped in the usual fashion for intra-ocular surgery.  A lid speculum was placed into the operative eye and the surgical microscope moved into place and focused.  Subconjunctival anesthesia was administered.  The temporal and nasal pterygia were excised.  A specimen was sent.  Hemostasis was achieved using wet field cautery.  The corneal bed was polished using a mechanical bur.  The defect size was measured.  Amniotic membrane was cut to the correct size and sutured into the nasal and temporal defect beds using 8-0 Vicryl suture.  The lid speculum was removed.  TobraDex ointment was placed into the eye.  Soft patch and hard shield was placed over the eye.  The patient tolerated the procedure well and was brought to the post-operative area in stable condition.  Post-Op Instructions: The patient will follow up at [facility] in 1 month for post-operative  care.

## 2016-03-02 ENCOUNTER — Encounter (HOSPITAL_COMMUNITY): Payer: Self-pay | Admitting: Ophthalmology

## 2016-04-09 DIAGNOSIS — M8580 Other specified disorders of bone density and structure, unspecified site: Secondary | ICD-10-CM

## 2016-04-09 HISTORY — DX: Other specified disorders of bone density and structure, unspecified site: M85.80

## 2016-05-03 DIAGNOSIS — H2513 Age-related nuclear cataract, bilateral: Secondary | ICD-10-CM | POA: Diagnosis not present

## 2016-06-27 DIAGNOSIS — I10 Essential (primary) hypertension: Secondary | ICD-10-CM | POA: Diagnosis not present

## 2016-06-27 DIAGNOSIS — K591 Functional diarrhea: Secondary | ICD-10-CM | POA: Diagnosis not present

## 2016-06-27 DIAGNOSIS — Z0001 Encounter for general adult medical examination with abnormal findings: Secondary | ICD-10-CM | POA: Diagnosis not present

## 2016-06-27 DIAGNOSIS — E782 Mixed hyperlipidemia: Secondary | ICD-10-CM | POA: Diagnosis not present

## 2016-06-27 DIAGNOSIS — H11002 Unspecified pterygium of left eye: Secondary | ICD-10-CM | POA: Diagnosis not present

## 2016-06-27 DIAGNOSIS — J302 Other seasonal allergic rhinitis: Secondary | ICD-10-CM | POA: Diagnosis not present

## 2016-06-27 DIAGNOSIS — Z6828 Body mass index (BMI) 28.0-28.9, adult: Secondary | ICD-10-CM | POA: Diagnosis not present

## 2016-06-27 DIAGNOSIS — Z23 Encounter for immunization: Secondary | ICD-10-CM | POA: Diagnosis not present

## 2016-06-27 DIAGNOSIS — H409 Unspecified glaucoma: Secondary | ICD-10-CM | POA: Diagnosis not present

## 2016-06-27 DIAGNOSIS — H269 Unspecified cataract: Secondary | ICD-10-CM | POA: Diagnosis not present

## 2016-06-27 DIAGNOSIS — K635 Polyp of colon: Secondary | ICD-10-CM | POA: Diagnosis not present

## 2016-06-27 DIAGNOSIS — E119 Type 2 diabetes mellitus without complications: Secondary | ICD-10-CM | POA: Diagnosis not present

## 2016-06-27 DIAGNOSIS — K219 Gastro-esophageal reflux disease without esophagitis: Secondary | ICD-10-CM | POA: Diagnosis not present

## 2016-08-02 DIAGNOSIS — H2513 Age-related nuclear cataract, bilateral: Secondary | ICD-10-CM | POA: Diagnosis not present

## 2016-09-14 DIAGNOSIS — Z23 Encounter for immunization: Secondary | ICD-10-CM | POA: Diagnosis not present

## 2016-10-25 DIAGNOSIS — H40013 Open angle with borderline findings, low risk, bilateral: Secondary | ICD-10-CM | POA: Diagnosis not present

## 2016-10-25 DIAGNOSIS — E119 Type 2 diabetes mellitus without complications: Secondary | ICD-10-CM | POA: Diagnosis not present

## 2016-10-25 DIAGNOSIS — Z7984 Long term (current) use of oral hypoglycemic drugs: Secondary | ICD-10-CM | POA: Diagnosis not present

## 2016-10-25 DIAGNOSIS — H2513 Age-related nuclear cataract, bilateral: Secondary | ICD-10-CM | POA: Diagnosis not present

## 2016-11-08 DIAGNOSIS — S8992XA Unspecified injury of left lower leg, initial encounter: Secondary | ICD-10-CM | POA: Diagnosis not present

## 2016-11-08 DIAGNOSIS — M79672 Pain in left foot: Secondary | ICD-10-CM | POA: Diagnosis not present

## 2016-11-08 DIAGNOSIS — S99922A Unspecified injury of left foot, initial encounter: Secondary | ICD-10-CM | POA: Diagnosis not present

## 2016-11-08 DIAGNOSIS — M25562 Pain in left knee: Secondary | ICD-10-CM | POA: Diagnosis not present

## 2016-11-08 DIAGNOSIS — Z299 Encounter for prophylactic measures, unspecified: Secondary | ICD-10-CM | POA: Diagnosis not present

## 2016-11-08 DIAGNOSIS — M79604 Pain in right leg: Secondary | ICD-10-CM | POA: Diagnosis not present

## 2016-11-08 DIAGNOSIS — Z713 Dietary counseling and surveillance: Secondary | ICD-10-CM | POA: Diagnosis not present

## 2016-11-08 DIAGNOSIS — M25572 Pain in left ankle and joints of left foot: Secondary | ICD-10-CM | POA: Diagnosis not present

## 2016-11-08 DIAGNOSIS — M79662 Pain in left lower leg: Secondary | ICD-10-CM | POA: Diagnosis not present

## 2016-11-08 DIAGNOSIS — Z6828 Body mass index (BMI) 28.0-28.9, adult: Secondary | ICD-10-CM | POA: Diagnosis not present

## 2017-01-02 DIAGNOSIS — K219 Gastro-esophageal reflux disease without esophagitis: Secondary | ICD-10-CM | POA: Diagnosis not present

## 2017-01-02 DIAGNOSIS — J302 Other seasonal allergic rhinitis: Secondary | ICD-10-CM | POA: Diagnosis not present

## 2017-01-02 DIAGNOSIS — Z6828 Body mass index (BMI) 28.0-28.9, adult: Secondary | ICD-10-CM | POA: Diagnosis not present

## 2017-01-02 DIAGNOSIS — I10 Essential (primary) hypertension: Secondary | ICD-10-CM | POA: Diagnosis not present

## 2017-01-02 DIAGNOSIS — E782 Mixed hyperlipidemia: Secondary | ICD-10-CM | POA: Diagnosis not present

## 2017-01-02 DIAGNOSIS — K635 Polyp of colon: Secondary | ICD-10-CM | POA: Diagnosis not present

## 2017-01-02 DIAGNOSIS — E119 Type 2 diabetes mellitus without complications: Secondary | ICD-10-CM | POA: Diagnosis not present

## 2017-01-10 ENCOUNTER — Other Ambulatory Visit: Payer: Self-pay | Admitting: Gynecology

## 2017-01-10 DIAGNOSIS — Z1231 Encounter for screening mammogram for malignant neoplasm of breast: Secondary | ICD-10-CM

## 2017-01-31 DIAGNOSIS — H16402 Unspecified corneal neovascularization, left eye: Secondary | ICD-10-CM | POA: Diagnosis not present

## 2017-01-31 DIAGNOSIS — H179 Unspecified corneal scar and opacity: Secondary | ICD-10-CM | POA: Diagnosis not present

## 2017-02-14 ENCOUNTER — Ambulatory Visit (INDEPENDENT_AMBULATORY_CARE_PROVIDER_SITE_OTHER): Payer: Medicare Other | Admitting: Gynecology

## 2017-02-14 ENCOUNTER — Encounter: Payer: Self-pay | Admitting: Gynecology

## 2017-02-14 ENCOUNTER — Ambulatory Visit
Admission: RE | Admit: 2017-02-14 | Discharge: 2017-02-14 | Disposition: A | Discharge status: Home / Self Care | Payer: Medicare Other | Source: Ambulatory Visit | Attending: Gynecology | Admitting: Gynecology

## 2017-02-14 VITALS — BP 122/84 | Ht <= 58 in | Wt 136.0 lb

## 2017-02-14 DIAGNOSIS — N816 Rectocele: Secondary | ICD-10-CM

## 2017-02-14 DIAGNOSIS — Z1231 Encounter for screening mammogram for malignant neoplasm of breast: Secondary | ICD-10-CM | POA: Diagnosis not present

## 2017-02-14 DIAGNOSIS — N952 Postmenopausal atrophic vaginitis: Secondary | ICD-10-CM

## 2017-02-14 DIAGNOSIS — M899 Disorder of bone, unspecified: Secondary | ICD-10-CM

## 2017-02-14 DIAGNOSIS — Z01411 Encounter for gynecological examination (general) (routine) with abnormal findings: Secondary | ICD-10-CM

## 2017-02-14 NOTE — Progress Notes (Signed)
    Alicia Dudley 1951-07-06 [id number]        66 y.o.  [id number] for breast and pelvic exam.  Past medical history,surgical history, problem list, medications, allergies, family history and social history were all reviewed and documented as reviewed in the EPIC chart.  ROS:  Performed with pertinent positives and negatives included in the history, assessment and plan.   Additional significant findings :  None   Exam: Copywriter, advertising Vitals:   02/14/17 0955  BP: 122/84  Weight: 136 lb (61.7 kg)  Height: 4' 9.5" (1.461 m)   Body mass index is 28.92 kg/m.  General appearance:  Normal affect, orientation and appearance. Skin: Grossly normal HEENT: Without gross lesions.  No cervical or supraclavicular adenopathy. Thyroid normal.  Lungs:  Clear without wheezing, rales or rhonchi Cardiac: RR, without RMG Abdominal:  Soft, nontender, without masses, guarding, rebound, organomegaly or hernia Breasts:  Examined lying and sitting without masses, retractions, discharge or axillary adenopathy. Pelvic:  Ext, BUS, Vagina: With atrophic changes. Second-degree rectocele noted. Cuff well supported. No significant cystocele.  Adnexa: Without masses or tenderness    Anus and perineum: Normal   Rectovaginal: Normal sphincter tone without palpated masses or tenderness.    Assessment/Plan:  66 y.o. [id number] female for breast and pelvic exam.   1. Postmenopausal/atrophic genital changes. Status post TAH/BSO for leiomyoma. No significant hot flushes, night sweats, vaginal dryness. 2. Osteopenia. DEXA 04/2016 T score -1.2. Plan repeat DEXA in several years. Increased calcium vitamin D. 3. Second-degree rectocele. Stable over serial exams. Does notice this with constipation but overall is asymptomatic. I rediscussed treatment options to include observation, pessary and surgery. Patient not interested in intervention at this time. 4. Colonoscopy 2016. Repeat at their recommended interval. 5. Pap smear  2015. No Pap smear done today. Reviewed current screening guidelines. Based on hysterectomy for benign indications and age we both agree to stop screening. 6. Mammography 2018. Repeat on an annual basis when due. SBE monthly reviewed. 7. Health maintenance. No routine lab work done as this is done at her primary physician's office. Follow up 1 year, sooner as needed.   Anastasio Auerbach MD, 10:14 AM 02/14/2017

## 2017-02-14 NOTE — Patient Instructions (Signed)

## 2017-02-15 ENCOUNTER — Encounter: Payer: Self-pay | Admitting: Gynecology

## 2017-02-18 DIAGNOSIS — H40033 Anatomical narrow angle, bilateral: Secondary | ICD-10-CM | POA: Diagnosis not present

## 2017-02-25 DIAGNOSIS — H18452 Nodular corneal degeneration, left eye: Secondary | ICD-10-CM | POA: Diagnosis not present

## 2017-04-29 DIAGNOSIS — I708 Atherosclerosis of other arteries: Secondary | ICD-10-CM | POA: Diagnosis not present

## 2017-04-29 DIAGNOSIS — I700 Atherosclerosis of aorta: Secondary | ICD-10-CM | POA: Diagnosis not present

## 2017-04-29 DIAGNOSIS — R1032 Left lower quadrant pain: Secondary | ICD-10-CM | POA: Diagnosis not present

## 2017-04-29 DIAGNOSIS — K5732 Diverticulitis of large intestine without perforation or abscess without bleeding: Secondary | ICD-10-CM | POA: Diagnosis not present

## 2017-04-29 DIAGNOSIS — R350 Frequency of micturition: Secondary | ICD-10-CM | POA: Diagnosis not present

## 2017-04-29 DIAGNOSIS — K5792 Diverticulitis of intestine, part unspecified, without perforation or abscess without bleeding: Secondary | ICD-10-CM | POA: Diagnosis not present

## 2017-04-29 DIAGNOSIS — Z299 Encounter for prophylactic measures, unspecified: Secondary | ICD-10-CM | POA: Diagnosis not present

## 2017-04-29 DIAGNOSIS — K5730 Diverticulosis of large intestine without perforation or abscess without bleeding: Secondary | ICD-10-CM | POA: Diagnosis not present

## 2017-04-29 DIAGNOSIS — Z6828 Body mass index (BMI) 28.0-28.9, adult: Secondary | ICD-10-CM | POA: Diagnosis not present

## 2017-04-29 DIAGNOSIS — K921 Melena: Secondary | ICD-10-CM | POA: Diagnosis not present

## 2017-05-01 DIAGNOSIS — Z6828 Body mass index (BMI) 28.0-28.9, adult: Secondary | ICD-10-CM | POA: Diagnosis not present

## 2017-05-01 DIAGNOSIS — Z299 Encounter for prophylactic measures, unspecified: Secondary | ICD-10-CM | POA: Diagnosis not present

## 2017-05-01 DIAGNOSIS — I10 Essential (primary) hypertension: Secondary | ICD-10-CM | POA: Diagnosis not present

## 2017-05-01 DIAGNOSIS — E785 Hyperlipidemia, unspecified: Secondary | ICD-10-CM | POA: Diagnosis not present

## 2017-05-01 DIAGNOSIS — K5792 Diverticulitis of intestine, part unspecified, without perforation or abscess without bleeding: Secondary | ICD-10-CM | POA: Diagnosis not present

## 2017-07-05 DIAGNOSIS — I10 Essential (primary) hypertension: Secondary | ICD-10-CM | POA: Diagnosis not present

## 2017-07-05 DIAGNOSIS — H409 Unspecified glaucoma: Secondary | ICD-10-CM | POA: Diagnosis not present

## 2017-07-05 DIAGNOSIS — K591 Functional diarrhea: Secondary | ICD-10-CM | POA: Diagnosis not present

## 2017-07-05 DIAGNOSIS — K219 Gastro-esophageal reflux disease without esophagitis: Secondary | ICD-10-CM | POA: Diagnosis not present

## 2017-07-05 DIAGNOSIS — E119 Type 2 diabetes mellitus without complications: Secondary | ICD-10-CM | POA: Diagnosis not present

## 2017-07-05 DIAGNOSIS — E782 Mixed hyperlipidemia: Secondary | ICD-10-CM | POA: Diagnosis not present

## 2017-07-05 DIAGNOSIS — K635 Polyp of colon: Secondary | ICD-10-CM | POA: Diagnosis not present

## 2017-07-09 DIAGNOSIS — K635 Polyp of colon: Secondary | ICD-10-CM | POA: Diagnosis not present

## 2017-07-09 DIAGNOSIS — K5730 Diverticulosis of large intestine without perforation or abscess without bleeding: Secondary | ICD-10-CM | POA: Diagnosis not present

## 2017-07-09 DIAGNOSIS — Z6829 Body mass index (BMI) 29.0-29.9, adult: Secondary | ICD-10-CM | POA: Diagnosis not present

## 2017-07-09 DIAGNOSIS — H269 Unspecified cataract: Secondary | ICD-10-CM | POA: Diagnosis not present

## 2017-07-09 DIAGNOSIS — Z0001 Encounter for general adult medical examination with abnormal findings: Secondary | ICD-10-CM | POA: Diagnosis not present

## 2017-07-09 DIAGNOSIS — E119 Type 2 diabetes mellitus without complications: Secondary | ICD-10-CM | POA: Diagnosis not present

## 2017-07-09 DIAGNOSIS — I10 Essential (primary) hypertension: Secondary | ICD-10-CM | POA: Diagnosis not present

## 2017-07-09 DIAGNOSIS — H409 Unspecified glaucoma: Secondary | ICD-10-CM | POA: Diagnosis not present

## 2017-07-18 DIAGNOSIS — H40033 Anatomical narrow angle, bilateral: Secondary | ICD-10-CM | POA: Diagnosis not present

## 2017-10-03 DIAGNOSIS — H2513 Age-related nuclear cataract, bilateral: Secondary | ICD-10-CM | POA: Diagnosis not present

## 2017-10-03 DIAGNOSIS — E119 Type 2 diabetes mellitus without complications: Secondary | ICD-10-CM | POA: Diagnosis not present

## 2017-10-29 DIAGNOSIS — Z7984 Long term (current) use of oral hypoglycemic drugs: Secondary | ICD-10-CM | POA: Diagnosis not present

## 2017-10-29 DIAGNOSIS — E119 Type 2 diabetes mellitus without complications: Secondary | ICD-10-CM | POA: Diagnosis not present

## 2017-10-29 DIAGNOSIS — E1165 Type 2 diabetes mellitus with hyperglycemia: Secondary | ICD-10-CM | POA: Diagnosis not present

## 2017-10-29 DIAGNOSIS — H40013 Open angle with borderline findings, low risk, bilateral: Secondary | ICD-10-CM | POA: Diagnosis not present

## 2018-01-02 DIAGNOSIS — K219 Gastro-esophageal reflux disease without esophagitis: Secondary | ICD-10-CM | POA: Diagnosis not present

## 2018-01-02 DIAGNOSIS — H2513 Age-related nuclear cataract, bilateral: Secondary | ICD-10-CM | POA: Diagnosis not present

## 2018-01-02 DIAGNOSIS — E119 Type 2 diabetes mellitus without complications: Secondary | ICD-10-CM | POA: Diagnosis not present

## 2018-01-02 DIAGNOSIS — E782 Mixed hyperlipidemia: Secondary | ICD-10-CM | POA: Diagnosis not present

## 2018-01-02 DIAGNOSIS — I10 Essential (primary) hypertension: Secondary | ICD-10-CM | POA: Diagnosis not present

## 2018-01-02 DIAGNOSIS — E876 Hypokalemia: Secondary | ICD-10-CM | POA: Diagnosis not present

## 2018-01-08 DIAGNOSIS — E782 Mixed hyperlipidemia: Secondary | ICD-10-CM | POA: Diagnosis not present

## 2018-01-08 DIAGNOSIS — I10 Essential (primary) hypertension: Secondary | ICD-10-CM | POA: Diagnosis not present

## 2018-01-08 DIAGNOSIS — E119 Type 2 diabetes mellitus without complications: Secondary | ICD-10-CM | POA: Diagnosis not present

## 2018-01-08 DIAGNOSIS — E876 Hypokalemia: Secondary | ICD-10-CM | POA: Diagnosis not present

## 2018-01-08 DIAGNOSIS — Z6829 Body mass index (BMI) 29.0-29.9, adult: Secondary | ICD-10-CM | POA: Diagnosis not present

## 2018-01-08 DIAGNOSIS — I700 Atherosclerosis of aorta: Secondary | ICD-10-CM | POA: Diagnosis not present

## 2018-01-08 DIAGNOSIS — K5730 Diverticulosis of large intestine without perforation or abscess without bleeding: Secondary | ICD-10-CM | POA: Diagnosis not present

## 2018-01-08 DIAGNOSIS — K219 Gastro-esophageal reflux disease without esophagitis: Secondary | ICD-10-CM | POA: Diagnosis not present

## 2018-01-14 NOTE — Patient Instructions (Signed)
Your procedure is scheduled on: 01/24/2018   Report to [facility] at  745    AM.  Call this number if you have problems the morning of surgery: [phone]   Do not eat food or drink liquids :After Midnight.      Take these medicines the morning of surgery with A SIP OF WATER: norvasc, losartan.   Do not wear jewelry, make-up or nail polish.  Do not wear lotions, powders, or perfumes. You may wear deodorant.  Do not shave 48 hours prior to surgery.  Do not bring valuables to the hospital.  Contacts, dentures or bridgework may not be worn into surgery.  Leave suitcase in the car. After surgery it may be brought to your room.  For patients admitted to the hospital, checkout time is 11:00 AM the day of discharge.   Patients discharged the day of surgery will not be allowed to drive home.  :     Please read over the following fact sheets that you were given: Coughing and Deep Breathing, Surgical Site Infection Prevention, Anesthesia Post-op Instructions and Care and Recovery After Surgery    Cataract A cataract is a clouding of the lens of the eye. When a lens becomes cloudy, vision is reduced based on the degree and nature of the clouding. Many cataracts reduce vision to some degree. Some cataracts make people more near-sighted as they develop. Other cataracts increase glare. Cataracts that are ignored and become worse can sometimes look white. The white color can be seen through the pupil. CAUSES   Aging. However, cataracts may occur at any age, even in newborns.   Certain drugs.   Trauma to the eye.   Certain diseases such as diabetes.   Specific eye diseases such as chronic inflammation inside the eye or a sudden attack of a rare form of glaucoma.   Inherited or acquired medical problems.  SYMPTOMS   Gradual, progressive drop in vision in the affected eye.   Severe, rapid visual loss. This most often happens when trauma is the cause.  DIAGNOSIS  To detect a cataract, an eye  doctor examines the lens. Cataracts are best diagnosed with an exam of the eyes with the pupils enlarged (dilated) by drops.  TREATMENT  For an early cataract, vision may improve by using different eyeglasses or stronger lighting. If that does not help your vision, surgery is the only effective treatment. A cataract needs to be surgically removed when vision loss interferes with your everyday activities, such as driving, reading, or watching TV. A cataract may also have to be removed if it prevents examination or treatment of another eye problem. Surgery removes the cloudy lens and usually replaces it with a substitute lens (intraocular lens, IOL).  At a time when both you and your doctor agree, the cataract will be surgically removed. If you have cataracts in both eyes, only one is usually removed at a time. This allows the operated eye to heal and be out of danger from any possible problems after surgery (such as infection or poor wound healing). In rare cases, a cataract may be doing damage to your eye. In these cases, your caregiver may advise surgical removal right away. The vast majority of people who have cataract surgery have better vision afterward. HOME CARE INSTRUCTIONS  If you are not planning surgery, you may be asked to do the following:  Use different eyeglasses.   Use stronger or brighter lighting.   Ask your eye doctor about reducing  your medicine dose or changing medicines if it is thought that a medicine caused your cataract. Changing medicines does not make the cataract go away on its own.   Become familiar with your surroundings. Poor vision can lead to injury. Avoid bumping into things on the affected side. You are at a higher risk for tripping or falling.   Exercise extreme care when driving or operating machinery.   Wear sunglasses if you are sensitive to bright light or experiencing problems with glare.  SEEK IMMEDIATE MEDICAL CARE IF:   You have a worsening or sudden  vision loss.   You notice redness, swelling, or increasing pain in the eye.   You have a fever.  Document Released: 11/26/2005 Document Revised: 11/15/2011 Document Reviewed: 07/20/2011 [facility] Patient Information 2012 [place].PATIENT INSTRUCTIONS POST-ANESTHESIA  IMMEDIATELY FOLLOWING SURGERY:  Do not drive or operate machinery for the first twenty four hours after surgery.  Do not make any important decisions for twenty four hours after surgery or while taking narcotic pain medications or sedatives.  If you develop intractable nausea and vomiting or a severe headache please notify your doctor immediately.  FOLLOW-UP:  Please make an appointment with your surgeon as instructed. You do not need to follow up with anesthesia unless specifically instructed to do so.  WOUND CARE INSTRUCTIONS (if applicable):  Keep a dry clean dressing on the anesthesia/puncture wound site if there is drainage.  Once the wound has quit draining you may leave it open to air.  Generally you should leave the bandage intact for twenty four hours unless there is drainage.  If the epidural site drains for more than 36-48 hours please call the anesthesia department.  QUESTIONS?:  Please feel free to call your physician or the hospital operator if you have any questions, and they will be happy to assist you.

## 2018-01-17 ENCOUNTER — Other Ambulatory Visit: Payer: Self-pay | Admitting: Gynecology

## 2018-01-17 ENCOUNTER — Encounter (HOSPITAL_COMMUNITY)
Admission: RE | Admit: 2018-01-17 | Discharge: 2018-01-17 | Disposition: A | Discharge status: Home / Self Care | Payer: Medicare Other | Source: Ambulatory Visit | Attending: Ophthalmology | Admitting: Ophthalmology

## 2018-01-17 DIAGNOSIS — Z1231 Encounter for screening mammogram for malignant neoplasm of breast: Secondary | ICD-10-CM

## 2018-01-17 DIAGNOSIS — H2512 Age-related nuclear cataract, left eye: Secondary | ICD-10-CM | POA: Diagnosis not present

## 2018-01-21 ENCOUNTER — Encounter (HOSPITAL_COMMUNITY): Payer: Self-pay

## 2018-01-21 ENCOUNTER — Encounter (HOSPITAL_COMMUNITY)
Admission: RE | Admit: 2018-01-21 | Discharge: 2018-01-21 | Disposition: A | Discharge status: Home / Self Care | Payer: Medicare Other | Source: Ambulatory Visit | Attending: Ophthalmology | Admitting: Ophthalmology

## 2018-01-21 ENCOUNTER — Other Ambulatory Visit: Payer: Self-pay

## 2018-01-21 DIAGNOSIS — E119 Type 2 diabetes mellitus without complications: Secondary | ICD-10-CM | POA: Diagnosis not present

## 2018-01-21 DIAGNOSIS — Z881 Allergy status to other antibiotic agents status: Secondary | ICD-10-CM | POA: Diagnosis not present

## 2018-01-21 DIAGNOSIS — H269 Unspecified cataract: Secondary | ICD-10-CM | POA: Diagnosis not present

## 2018-01-21 DIAGNOSIS — Z882 Allergy status to sulfonamides status: Secondary | ICD-10-CM | POA: Diagnosis not present

## 2018-01-21 DIAGNOSIS — I10 Essential (primary) hypertension: Secondary | ICD-10-CM | POA: Diagnosis not present

## 2018-01-21 LAB — CBC WITH DIFFERENTIAL/PLATELET
BASOS ABS: 0.1 10*3/uL (ref 0.0–0.1)
Basophils Relative: 1 %
EOS ABS: 0.1 10*3/uL (ref 0.0–0.7)
EOS PCT: 1 %
HCT: 44.3 % (ref 36.0–46.0)
Hemoglobin: 14.5 g/dL (ref 12.0–15.0)
Lymphocytes Relative: 36 %
Lymphs Abs: 2.8 10*3/uL (ref 0.7–4.0)
MCH: 30.5 pg (ref 26.0–34.0)
MCHC: 32.7 g/dL (ref 30.0–36.0)
MCV: 93.3 fL (ref 78.0–100.0)
MONO ABS: 0.4 10*3/uL (ref 0.1–1.0)
Monocytes Relative: 5 %
Neutro Abs: 4.3 10*3/uL (ref 1.7–7.7)
Neutrophils Relative %: 57 %
PLATELETS: 318 10*3/uL (ref 150–400)
RBC: 4.75 MIL/uL (ref 3.87–5.11)
RDW: 12.1 % (ref 11.5–15.5)
WBC: 7.6 10*3/uL (ref 4.0–10.5)

## 2018-01-21 LAB — BASIC METABOLIC PANEL
Anion gap: 12 (ref 5–15)
BUN: 11 mg/dL (ref 6–20)
CO2: 27 mmol/L (ref 22–32)
Calcium: 9.2 mg/dL (ref 8.9–10.3)
Chloride: 100 mmol/L — ABNORMAL LOW (ref 101–111)
Creatinine, Ser: 0.55 mg/dL (ref 0.44–1.00)
GFR calc Af Amer: >60 mL/min (ref >60)
GLUCOSE: 142 mg/dL — AB (ref 65–99)
POTASSIUM: 3.1 mmol/L — AB (ref 3.5–5.1)
SODIUM: 139 mmol/L (ref 135–145)

## 2018-01-21 LAB — HEMOGLOBIN A1C
Hgb A1c MFr Bld: 6.5 % — ABNORMAL HIGH (ref 4.8–5.6)
Mean Plasma Glucose: 139.85 mg/dL

## 2018-01-21 LAB — GLUCOSE, CAPILLARY: GLUCOSE-CAPILLARY: 131 mg/dL — AB (ref 65–99)

## 2018-01-22 NOTE — Pre-Procedure Instructions (Signed)
Potassium of 3.1 routed and called to Dr Richardson Landry Burdine's office.

## 2018-01-24 ENCOUNTER — Encounter (HOSPITAL_COMMUNITY): Payer: Self-pay | Admitting: *Deleted

## 2018-01-24 ENCOUNTER — Other Ambulatory Visit: Payer: Self-pay

## 2018-01-24 ENCOUNTER — Ambulatory Visit (HOSPITAL_COMMUNITY)
Admission: RE | Admit: 2018-01-24 | Discharge: 2018-01-24 | Disposition: A | Discharge status: Home / Self Care | Payer: Medicare Other | Source: Ambulatory Visit | Attending: Ophthalmology | Admitting: Ophthalmology

## 2018-01-24 ENCOUNTER — Ambulatory Visit (HOSPITAL_COMMUNITY): Payer: Medicare Other | Admitting: Anesthesiology

## 2018-01-24 ENCOUNTER — Encounter (HOSPITAL_COMMUNITY)
Admission: RE | Disposition: A | Discharge status: Home / Self Care | Payer: Self-pay | Source: Ambulatory Visit | Attending: Ophthalmology

## 2018-01-24 DIAGNOSIS — Z882 Allergy status to sulfonamides status: Secondary | ICD-10-CM | POA: Diagnosis not present

## 2018-01-24 DIAGNOSIS — H269 Unspecified cataract: Secondary | ICD-10-CM | POA: Diagnosis not present

## 2018-01-24 DIAGNOSIS — H2512 Age-related nuclear cataract, left eye: Secondary | ICD-10-CM | POA: Diagnosis not present

## 2018-01-24 DIAGNOSIS — Z881 Allergy status to other antibiotic agents status: Secondary | ICD-10-CM | POA: Diagnosis not present

## 2018-01-24 DIAGNOSIS — I10 Essential (primary) hypertension: Secondary | ICD-10-CM | POA: Diagnosis not present

## 2018-01-24 DIAGNOSIS — E119 Type 2 diabetes mellitus without complications: Secondary | ICD-10-CM | POA: Insufficient documentation

## 2018-01-24 HISTORY — PX: CATARACT EXTRACTION W/PHACO: SHX586

## 2018-01-24 LAB — GLUCOSE, CAPILLARY: GLUCOSE-CAPILLARY: 128 mg/dL — AB (ref 65–99)

## 2018-01-24 SURGERY — PHACOEMULSIFICATION, CATARACT, WITH IOL INSERTION
Anesthesia: Monitor Anesthesia Care | Site: Eye | Laterality: Left

## 2018-01-24 MED ORDER — EPINEPHRINE PF 1 MG/ML IJ SOLN
INTRAMUSCULAR | Status: AC
  Filled 2018-01-24: qty 1

## 2018-01-24 MED ORDER — CYCLOPENTOLATE-PHENYLEPHRINE 0.2-1 % OP SOLN
1.0000 [drp] | OPHTHALMIC | Status: AC
  Administered 2018-01-24 (×3): 1 [drp] via OPHTHALMIC

## 2018-01-24 MED ORDER — PROVISC 10 MG/ML IO SOLN
INTRAOCULAR | Status: DC | PRN
  Administered 2018-01-24: 0.85 mL via INTRAOCULAR

## 2018-01-24 MED ORDER — MIDAZOLAM HCL 2 MG/2ML IJ SOLN
INTRAMUSCULAR | Status: AC
  Filled 2018-01-24: qty 2

## 2018-01-24 MED ORDER — FENTANYL CITRATE (PF) 100 MCG/2ML IJ SOLN
25.0000 ug | Freq: Once | INTRAMUSCULAR | Status: AC
  Administered 2018-01-24: 25 ug via INTRAVENOUS

## 2018-01-24 MED ORDER — BSS IO SOLN
INTRAOCULAR | Status: DC | PRN
  Administered 2018-01-24: 15 mL via INTRAOCULAR

## 2018-01-24 MED ORDER — LACTATED RINGERS IV SOLN
INTRAVENOUS | Status: DC
  Administered 2018-01-24: 09:00:00 via INTRAVENOUS

## 2018-01-24 MED ORDER — PHENYLEPHRINE HCL 2.5 % OP SOLN
1.0000 [drp] | OPHTHALMIC | Status: AC
  Administered 2018-01-24 (×3): 1 [drp] via OPHTHALMIC

## 2018-01-24 MED ORDER — MIDAZOLAM HCL 2 MG/2ML IJ SOLN
1.0000 mg | INTRAMUSCULAR | Status: AC
  Administered 2018-01-24: 2 mg via INTRAVENOUS

## 2018-01-24 MED ORDER — NEOMYCIN-POLYMYXIN-DEXAMETH 3.5-10000-0.1 OP SUSP
OPHTHALMIC | Status: DC | PRN
  Administered 2018-01-24: 2 [drp] via OPHTHALMIC

## 2018-01-24 MED ORDER — POVIDONE-IODINE 5 % OP SOLN
OPHTHALMIC | Status: DC | PRN
  Administered 2018-01-24: 1 via OPHTHALMIC

## 2018-01-24 MED ORDER — LIDOCAINE HCL 3.5 % OP GEL
1.0000 "application " | Freq: Once | OPHTHALMIC | Status: AC
  Administered 2018-01-24: 1 via OPHTHALMIC

## 2018-01-24 MED ORDER — TETRACAINE HCL 0.5 % OP SOLN
1.0000 [drp] | OPHTHALMIC | Status: AC
  Administered 2018-01-24 (×3): 1 [drp] via OPHTHALMIC

## 2018-01-24 MED ORDER — SODIUM HYALURONATE 23 MG/ML IO SOLN
INTRAOCULAR | Status: DC | PRN
  Administered 2018-01-24: 0.6 mL via INTRAOCULAR

## 2018-01-24 MED ORDER — EPINEPHRINE PF 1 MG/ML IJ SOLN
INTRAOCULAR | Status: DC | PRN
  Administered 2018-01-24: 500 mL

## 2018-01-24 MED ORDER — LIDOCAINE HCL (PF) 1 % IJ SOLN
INTRAMUSCULAR | Status: DC | PRN
  Administered 2018-01-24: 1 mL via OPHTHALMIC

## 2018-01-24 MED ORDER — FENTANYL CITRATE (PF) 100 MCG/2ML IJ SOLN
INTRAMUSCULAR | Status: AC
  Filled 2018-01-24: qty 2

## 2018-01-24 SURGICAL SUPPLY — 15 items
CLOTH BEACON ORANGE TIMEOUT ST (SAFETY) ×3 IMPLANT
EYE SHIELD UNIVERSAL CLEAR (GAUZE/BANDAGES/DRESSINGS) ×3 IMPLANT
GLOVE BIOGEL PI IND STRL 6.5 (GLOVE) ×1 IMPLANT
GLOVE BIOGEL PI IND STRL 7.0 (GLOVE) ×1 IMPLANT
GLOVE BIOGEL PI INDICATOR 6.5 (GLOVE) ×2
GLOVE BIOGEL PI INDICATOR 7.0 (GLOVE) ×2
NEEDLE HYPO 18GX1.5 BLUNT FILL (NEEDLE) ×3 IMPLANT
PAD ARMBOARD 7.5X6 YLW CONV (MISCELLANEOUS) ×3 IMPLANT
RING MALYGIN (MISCELLANEOUS) IMPLANT
SIGHTPATH CAT PROC W REG LENS (Ophthalmic Related) ×3 IMPLANT
SYR TB 1ML LL NO SAFETY (SYRINGE) ×3 IMPLANT
TAPE SURG TRANSPORE 1 IN (GAUZE/BANDAGES/DRESSINGS) ×1 IMPLANT
TAPE SURGICAL TRANSPORE 1 IN (GAUZE/BANDAGES/DRESSINGS) ×2
VISCOELASTIC ADDITIONAL (OPHTHALMIC RELATED) ×3 IMPLANT
WATER STERILE IRR 250ML POUR (IV SOLUTION) ×3 IMPLANT

## 2018-01-24 NOTE — Anesthesia Preprocedure Evaluation (Signed)
Anesthesia Evaluation  Patient identified by MRN, date of birth, ID band Patient awake    Reviewed: Allergy & Precautions, NPO status , Patient's Chart, lab work & pertinent test results  Airway Mallampati: III  TM Distance: <3 FB     Dental  (+) Teeth Intact   Pulmonary neg pulmonary ROS,    breath sounds clear to auscultation       Cardiovascular hypertension, Pt. on medications  Rhythm:Regular Rate:Normal     Neuro/Psych    GI/Hepatic negative GI ROS,   Endo/Other  diabetes, Type 2, Oral Hypoglycemic Agents  Renal/GU      Musculoskeletal   Abdominal   Peds  Hematology   Anesthesia Other Findings   Reproductive/Obstetrics                             Anesthesia Physical Anesthesia Plan  ASA: II  Anesthesia Plan: MAC   Post-op Pain Management:    Induction: Intravenous  PONV Risk Score and Plan:   Airway Management Planned: Nasal Cannula  Additional Equipment:   Intra-op Plan:   Post-operative Plan:   Informed Consent: I have reviewed the patients History and Physical, chart, labs and discussed the procedure including the risks, benefits and alternatives for the proposed anesthesia with the patient or authorized representative who has indicated his/her understanding and acceptance.     Plan Discussed with:   Anesthesia Plan Comments:         Anesthesia Quick Evaluation

## 2018-01-24 NOTE — H&P (Signed)
The H and P was reviewed and updated. The patient was examined.  No changes were found after exam.  The surgical eye was marked.  

## 2018-01-24 NOTE — Discharge Instructions (Signed)
Please discharge patient when stable, will follow up today with Dr. Hallis Meditz at the [facility] office immediately following discharge.  Leave shield in place until visit.  All paperwork with discharge instructions will be given at the office. ° ° °PATIENT INSTRUCTIONS °POST-ANESTHESIA ° °IMMEDIATELY FOLLOWING SURGERY:  Do not drive or operate machinery for the first twenty four hours after surgery.  Do not make any important decisions for twenty four hours after surgery or while taking narcotic pain medications or sedatives.  If you develop intractable nausea and vomiting or a severe headache please notify your doctor immediately. ° °FOLLOW-UP:  Please make an appointment with your surgeon as instructed. You do not need to follow up with anesthesia unless specifically instructed to do so. ° °WOUND CARE INSTRUCTIONS (if applicable):  Keep a dry clean dressing on the anesthesia/puncture wound site if there is drainage.  Once the wound has quit draining you may leave it open to air.  Generally you should leave the bandage intact for twenty four hours unless there is drainage.  If the epidural site drains for more than 36-48 hours please call the anesthesia department. ° °QUESTIONS?:  Please feel free to call your physician or the hospital operator if you have any questions, and they will be happy to assist you.    ° ° ° °

## 2018-01-24 NOTE — Transfer of Care (Signed)
Immediate Anesthesia Transfer of Care Note  Patient: Alicia Dudley  Procedure(s) Performed: CATARACT EXTRACTION PHACO AND INTRAOCULAR LENS PLACEMENT LEFT EYE (Left Eye)  Patient Location: Short Stay  Anesthesia Type:MAC  Level of Consciousness: awake, alert , oriented and patient cooperative  Airway & Oxygen Therapy: Patient Spontanous Breathing  Post-op Assessment: Report given to RN and Post -op Vital signs reviewed and stable  Post vital signs: Reviewed and stable  Last Vitals:  Vitals:   01/24/18 0905 01/24/18 0910  BP: (!) 99/58 (!) 99/56  Pulse:    Resp: 12 14  Temp:    SpO2: 95% 95%    Last Pain:  Vitals:   01/24/18 0806  TempSrc: Oral         Complications: No apparent anesthesia complications

## 2018-01-24 NOTE — Anesthesia Postprocedure Evaluation (Signed)
Anesthesia Post Note  Patient: Janese Radabaugh  Procedure(s) Performed: CATARACT EXTRACTION PHACO AND INTRAOCULAR LENS PLACEMENT LEFT EYE (Left Eye)  Patient location during evaluation: Short Stay Anesthesia Type: MAC Level of consciousness: awake and alert, oriented and patient cooperative Pain management: pain level controlled Vital Signs Assessment: post-procedure vital signs reviewed and stable Respiratory status: spontaneous breathing and respiratory function stable Cardiovascular status: blood pressure returned to baseline and stable Postop Assessment: no headache and adequate PO intake Anesthetic complications: no     Last Vitals:  Vitals:   01/24/18 0905 01/24/18 0910  BP: (!) 99/58 (!) 99/56  Pulse:    Resp: 12 14  Temp:    SpO2: 95% 95%    Last Pain:  Vitals:   01/24/18 0806  TempSrc: Oral                 Larren Copes

## 2018-01-24 NOTE — Op Note (Signed)
Date of procedure: 01/24/18  Pre-operative diagnosis: Visually significant cataract, Left Eye  Post-operative diagnosis: Visually significant cataract, Left Eye  Procedure: Removal of cataract via phacoemulsification and insertion of intra-ocular lens Alicia Dudley and Alicia Dudley  +24.5D into the capsular bag of the Left Eye  Attending surgeon: Gerda Diss. Yaman Grauberger, MD, MA  Anesthesia: MAC, Topical Akten  Complications: None  Estimated Blood Loss: <24m (minimal)  Specimens: None  Implants: As above  Indications:  Visually significant cataract, Left Eye  Procedure:  The patient was seen and identified in the pre-operative area. The operative eye was identified and dilated.  The operative eye was marked.  Topical anesthesia was administered to the operative eye.     The patient was then to the operative suite and placed in the supine position.  A timeout was performed confirming the patient, procedure to be performed, and all other relevant information.   The patient's face was prepped and draped in the usual fashion for intra-ocular surgery.  A lid speculum was placed into the operative eye and the surgical microscope moved into place and focused.  A temporal paracentesis was created using a 20 gauge paracentesis blade.  Shugarcaine was injected into the anterior chamber.  Viscoelastic was injected into the anterior chamber.  A superior clear-corneal main wound incision was created using a 2.425mmicrokeratome.  A continuous curvilinear capsulorrhexis was initiated using an irrigating cystitome and completed using capsulorrhexis forceps.  Hydrodissection and hydrodeliniation were performed.  Viscoelastic was injected into the anterior chamber.  A phacoemulsification handpiece and a chopper as a second instrument were used to remove the nucleus and epinucleus. The irrigation/aspiration handpiece was used to remove any remaining cortical material.   The capsular bag was reinflated with  viscoelastic, checked, and found to be intact.  The intraocular lens was inserted into the capsular bag and dialed into place using a Kuglen hook.  The irrigation/aspiration handpiece was used to remove any remaining viscoelastic.  The clear corneal wound and paracentesis wounds were then hydrated and checked with Weck-Cels to be watertight.  The lid-speculum and drape was removed, and the patient's face was cleaned with a wet and dry 4x4.  Maxitrol was instilled in the eye before a clear shield was taped over the eye. The patient was taken to the post-operative care unit in good condition, having tolerated the procedure well.  Post-Op Instructions: [facility] and will receive all other orders and instructions.

## 2018-01-27 ENCOUNTER — Encounter (HOSPITAL_COMMUNITY): Payer: Self-pay | Admitting: Ophthalmology

## 2018-02-17 ENCOUNTER — Encounter: Payer: Medicare Other | Admitting: Gynecology

## 2018-03-14 ENCOUNTER — Ambulatory Visit
Admission: RE | Admit: 2018-03-14 | Discharge: 2018-03-14 | Disposition: A | Discharge status: Home / Self Care | Payer: Medicare Other | Source: Ambulatory Visit | Attending: Gynecology | Admitting: Gynecology

## 2018-03-14 ENCOUNTER — Ambulatory Visit (INDEPENDENT_AMBULATORY_CARE_PROVIDER_SITE_OTHER): Payer: Medicare Other | Admitting: Gynecology

## 2018-03-14 ENCOUNTER — Encounter: Payer: Self-pay | Admitting: Gynecology

## 2018-03-14 VITALS — BP 118/76 | Ht <= 58 in | Wt 134.0 lb

## 2018-03-14 DIAGNOSIS — Z1231 Encounter for screening mammogram for malignant neoplasm of breast: Secondary | ICD-10-CM | POA: Diagnosis not present

## 2018-03-14 DIAGNOSIS — N952 Postmenopausal atrophic vaginitis: Secondary | ICD-10-CM

## 2018-03-14 DIAGNOSIS — N816 Rectocele: Secondary | ICD-10-CM

## 2018-03-14 DIAGNOSIS — M8580 Other specified disorders of bone density and structure, unspecified site: Secondary | ICD-10-CM

## 2018-03-14 DIAGNOSIS — Z01411 Encounter for gynecological examination (general) (routine) with abnormal findings: Secondary | ICD-10-CM

## 2018-03-14 NOTE — Progress Notes (Signed)
    Alicia Dudley May 07, 1951 [id number]        66 y.o.  G2P2002 for breast and pelvic exam.  Doing well without complaints.  Past medical history,surgical history, problem list, medications, allergies, family history and social history were all reviewed and documented as reviewed in the EPIC chart.  ROS:  Performed with pertinent positives and negatives included in the history, assessment and plan.   Additional significant findings : None   Exam: Caryn Bee assistant Vitals:   03/14/18 1001  BP: 118/76  Weight: 134 lb (60.8 kg)  Height: 4\' 9"  (1.448 m)   Body mass index is 29 kg/m.  General appearance:  Normal affect, orientation and appearance. Skin: Grossly normal HEENT: Without gross lesions.  No cervical or supraclavicular adenopathy. Thyroid normal.  Lungs:  Clear without wheezing, rales or rhonchi Cardiac: RR, without RMG Abdominal:  Soft, nontender, without masses, guarding, rebound, organomegaly or hernia Breasts:  Examined lying and sitting without masses, retractions, discharge or axillary adenopathy. Pelvic:  Ext, BUS, Vagina: With atrophic changes.  Second-degree rectocele noted.  Cuff well supported.  No significant cystocele  Adnexa: Without masses or tenderness    Anus and perineum: Normal   Rectovaginal: Normal sphincter tone without palpated masses or tenderness.    Assessment/Plan:  67 y.o. [id number] female for breast and pelvic exam, status post TAH/BSO for leiomyoma.   1. Postmenopausal/atrophic genital changes.  No significant symptoms. 2. Rectocele, second-degree.  Reviewed findings with her.  Stable over serial exams.  Symptoms such as pressure, protrusion from the vagina and stool trapping reviewed.  Patient's asymptomatic and will continue to monitor and report any issues. 3. Osteopenia.  DEXA 2017 T score -1.2.  She obtains these through her medical physician's office.  Discussed my recommendation to repeat in another year or 2.  She will follow-up with  them for ongoing bone health management. 4. Mammography today.  Breast exam normal today. 5. Colonoscopy 2016.  Repeat at their recommended interval. 6. Pap smear 2015.  No Pap smear done today.  No history of significant abnormal Pap smears.  Per current screening guidelines we both agree to stop screening based on age and hysterectomy history. 7. Health maintenance.  No routine lab work done as patient does this elsewhere.  Follow-up 1 year, sooner as needed.   Anastasio Auerbach MD, 10:36 AM 03/14/2018

## 2018-03-14 NOTE — Patient Instructions (Signed)
Follow-up in 1 year for annual exam, sooner as needed. 

## 2018-03-27 DIAGNOSIS — H2511 Age-related nuclear cataract, right eye: Secondary | ICD-10-CM | POA: Diagnosis not present

## 2018-03-31 ENCOUNTER — Encounter (HOSPITAL_COMMUNITY): Payer: Self-pay

## 2018-03-31 ENCOUNTER — Encounter (HOSPITAL_COMMUNITY)
Admission: RE | Admit: 2018-03-31 | Discharge: 2018-03-31 | Disposition: A | Discharge status: Home / Self Care | Payer: Medicare Other | Source: Ambulatory Visit | Attending: Ophthalmology | Admitting: Ophthalmology

## 2018-04-02 MED ORDER — TETRACAINE HCL 0.5 % OP SOLN
OPHTHALMIC | Status: AC
  Filled 2018-04-02: qty 4

## 2018-04-02 MED ORDER — PHENYLEPHRINE HCL 2.5 % OP SOLN
OPHTHALMIC | Status: AC
  Filled 2018-04-02: qty 15

## 2018-04-02 MED ORDER — LIDOCAINE HCL 3.5 % OP GEL
OPHTHALMIC | Status: AC
  Filled 2018-04-02: qty 1

## 2018-04-02 MED ORDER — LIDOCAINE HCL (PF) 1 % IJ SOLN
INTRAMUSCULAR | Status: AC
  Filled 2018-04-02: qty 2

## 2018-04-02 MED ORDER — CYCLOPENTOLATE-PHENYLEPHRINE 0.2-1 % OP SOLN
OPHTHALMIC | Status: AC
  Filled 2018-04-02: qty 2

## 2018-04-02 MED ORDER — NEOMYCIN-POLYMYXIN-DEXAMETH 3.5-10000-0.1 OP SUSP
OPHTHALMIC | Status: AC
  Filled 2018-04-02: qty 5

## 2018-04-03 ENCOUNTER — Ambulatory Visit (HOSPITAL_COMMUNITY): Payer: Medicare Other | Admitting: Anesthesiology

## 2018-04-03 ENCOUNTER — Encounter (HOSPITAL_COMMUNITY)
Admission: RE | Disposition: A | Discharge status: Home / Self Care | Payer: Self-pay | Source: Ambulatory Visit | Attending: Ophthalmology

## 2018-04-03 ENCOUNTER — Ambulatory Visit (HOSPITAL_COMMUNITY)
Admission: RE | Admit: 2018-04-03 | Discharge: 2018-04-03 | Disposition: A | Discharge status: Home / Self Care | Payer: Medicare Other | Source: Ambulatory Visit | Attending: Ophthalmology | Admitting: Ophthalmology

## 2018-04-03 ENCOUNTER — Encounter (HOSPITAL_COMMUNITY): Payer: Self-pay | Admitting: *Deleted

## 2018-04-03 DIAGNOSIS — E119 Type 2 diabetes mellitus without complications: Secondary | ICD-10-CM | POA: Diagnosis not present

## 2018-04-03 DIAGNOSIS — I10 Essential (primary) hypertension: Secondary | ICD-10-CM | POA: Insufficient documentation

## 2018-04-03 DIAGNOSIS — Z7984 Long term (current) use of oral hypoglycemic drugs: Secondary | ICD-10-CM | POA: Insufficient documentation

## 2018-04-03 DIAGNOSIS — Z79899 Other long term (current) drug therapy: Secondary | ICD-10-CM | POA: Diagnosis not present

## 2018-04-03 DIAGNOSIS — H2511 Age-related nuclear cataract, right eye: Secondary | ICD-10-CM | POA: Insufficient documentation

## 2018-04-03 DIAGNOSIS — Z882 Allergy status to sulfonamides status: Secondary | ICD-10-CM | POA: Diagnosis not present

## 2018-04-03 HISTORY — PX: CATARACT EXTRACTION W/PHACO: SHX586

## 2018-04-03 LAB — GLUCOSE, CAPILLARY: Glucose-Capillary: 142 mg/dL — ABNORMAL HIGH (ref 65–99)

## 2018-04-03 SURGERY — PHACOEMULSIFICATION, CATARACT, WITH IOL INSERTION
Anesthesia: Monitor Anesthesia Care | Site: Eye | Laterality: Right

## 2018-04-03 MED ORDER — PHENYLEPHRINE HCL 2.5 % OP SOLN
1.0000 [drp] | OPHTHALMIC | Status: AC
  Administered 2018-04-03 (×3): 1 [drp] via OPHTHALMIC

## 2018-04-03 MED ORDER — NEOMYCIN-POLYMYXIN-DEXAMETH 3.5-10000-0.1 OP SUSP
OPHTHALMIC | Status: DC | PRN
  Administered 2018-04-03: 2 [drp] via OPHTHALMIC

## 2018-04-03 MED ORDER — BSS IO SOLN
INTRAOCULAR | Status: DC | PRN
  Administered 2018-04-03: 15 mL

## 2018-04-03 MED ORDER — CYCLOPENTOLATE-PHENYLEPHRINE 0.2-1 % OP SOLN
1.0000 [drp] | OPHTHALMIC | Status: AC
  Administered 2018-04-03 (×3): 1 [drp] via OPHTHALMIC

## 2018-04-03 MED ORDER — LIDOCAINE HCL 3.5 % OP GEL
1.0000 "application " | Freq: Once | OPHTHALMIC | Status: AC
  Administered 2018-04-03: 1 via OPHTHALMIC

## 2018-04-03 MED ORDER — SODIUM HYALURONATE 23 MG/ML IO SOLN
INTRAOCULAR | Status: DC | PRN
  Administered 2018-04-03: 0.6 mL via INTRAOCULAR

## 2018-04-03 MED ORDER — MIDAZOLAM HCL 2 MG/2ML IJ SOLN
INTRAMUSCULAR | Status: AC
  Filled 2018-04-03: qty 2

## 2018-04-03 MED ORDER — LACTATED RINGERS IV SOLN
INTRAVENOUS | Status: DC
  Administered 2018-04-03: 07:00:00 via INTRAVENOUS

## 2018-04-03 MED ORDER — PROVISC 10 MG/ML IO SOLN
INTRAOCULAR | Status: DC | PRN
  Administered 2018-04-03: 0.85 mL via INTRAOCULAR

## 2018-04-03 MED ORDER — LIDOCAINE HCL (PF) 1 % IJ SOLN
INTRAOCULAR | Status: DC | PRN
  Administered 2018-04-03: 1 mL via OPHTHALMIC

## 2018-04-03 MED ORDER — POVIDONE-IODINE 5 % OP SOLN
OPHTHALMIC | Status: DC | PRN
  Administered 2018-04-03: 1 via OPHTHALMIC

## 2018-04-03 MED ORDER — EPINEPHRINE PF 1 MG/ML IJ SOLN
INTRAOCULAR | Status: DC | PRN
  Administered 2018-04-03: 500 mL

## 2018-04-03 MED ORDER — MIDAZOLAM HCL 5 MG/5ML IJ SOLN
INTRAMUSCULAR | Status: DC | PRN
  Administered 2018-04-03: 2 mg via INTRAVENOUS

## 2018-04-03 MED ORDER — TETRACAINE HCL 0.5 % OP SOLN
1.0000 [drp] | OPHTHALMIC | Status: AC
  Administered 2018-04-03 (×3): 1 [drp] via OPHTHALMIC

## 2018-04-03 SURGICAL SUPPLY — 14 items
CLOTH BEACON ORANGE TIMEOUT ST (SAFETY) ×3 IMPLANT
EYE SHIELD UNIVERSAL CLEAR (GAUZE/BANDAGES/DRESSINGS) ×3 IMPLANT
GLOVE BIOGEL PI IND STRL 6.5 (GLOVE) ×1 IMPLANT
GLOVE BIOGEL PI IND STRL 7.0 (GLOVE) ×1 IMPLANT
GLOVE BIOGEL PI INDICATOR 6.5 (GLOVE) ×2
GLOVE BIOGEL PI INDICATOR 7.0 (GLOVE) ×2
LENS ALC ACRYL/TECN (Ophthalmic Related) ×3 IMPLANT
NEEDLE HYPO 18GX1.5 BLUNT FILL (NEEDLE) ×3 IMPLANT
PAD ARMBOARD 7.5X6 YLW CONV (MISCELLANEOUS) ×3 IMPLANT
SYR TB 1ML LL NO SAFETY (SYRINGE) ×3 IMPLANT
TAPE SURG TRANSPORE 1 IN (GAUZE/BANDAGES/DRESSINGS) ×1 IMPLANT
TAPE SURGICAL TRANSPORE 1 IN (GAUZE/BANDAGES/DRESSINGS) ×2
VISCOELASTIC ADDITIONAL (OPHTHALMIC RELATED) ×3 IMPLANT
WATER STERILE IRR 250ML POUR (IV SOLUTION) ×3 IMPLANT

## 2018-04-03 NOTE — Op Note (Signed)
Date of procedure: 04/03/18  Pre-operative diagnosis: Visually significant cataract, Right Eye (H25.11)  Post-operative diagnosis: Visually significant cataract, Right Eye  Procedure: Removal of cataract via phacoemulsification and insertion of intra-ocular lens [employer] PCB00  +25.0D into the capsular bag of the Right Eye  Attending surgeon: Gerda Diss. Janei Scheff, MD, MA  Anesthesia: MAC, Topical Akten  Complications: None  Estimated Blood Loss: <61m (minimal)  Specimens: None  Implants: As above  Indications:  Visually significant cataract, Right Eye  Procedure:  The patient was seen and identified in the pre-operative area. The operative eye was identified and dilated.  The operative eye was marked.  Topical anesthesia was administered to the operative eye.     The patient was then to the operative suite and placed in the supine position.  A timeout was performed confirming the patient, procedure to be performed, and all other relevant information.   The patient's face was prepped and draped in the usual fashion for intra-ocular surgery.  A lid speculum was placed into the operative eye and the surgical microscope moved into place and focused.  A superotemporal paracentesis was created using a 20 gauge paracentesis blade.  Shugarcaine was injected into the anterior chamber.  Viscoelastic was injected into the anterior chamber.  A temporal clear-corneal main wound incision was created using a 2.483mmicrokeratome.  A continuous curvilinear capsulorrhexis was initiated using an irrigating cystitome and completed using capsulorrhexis forceps.  Hydrodissection and hydrodeliniation were performed.  Viscoelastic was injected into the anterior chamber.  A phacoemulsification handpiece and a chopper as a second instrument were used to remove the nucleus and epinucleus. The irrigation/aspiration handpiece was used to remove any remaining cortical material.   The capsular bag was  reinflated with viscoelastic, checked, and found to be intact.  The intraocular lens was inserted into the capsular bag and dialed into place using a Kuglen hook.  The irrigation/aspiration handpiece was used to remove any remaining viscoelastic.  The clear corneal wound and paracentesis wounds were then hydrated and checked with Weck-Cels to be watertight.  The lid-speculum and drape was removed, and the patient's face was cleaned with a wet and dry 4x4.  Maxitrol was instilled in the eye before a clear shield was taped over the eye. The patient was taken to the post-operative care unit in good condition, having tolerated the procedure well.  Post-Op Instructions: [facility] and will receive all other orders and instructions.

## 2018-04-03 NOTE — Anesthesia Postprocedure Evaluation (Signed)
Anesthesia Post Note  Patient: Alicia Dudley  Procedure(s) Performed: CATARACT EXTRACTION PHACO AND INTRAOCULAR LENS PLACEMENT (IOC) (Right Eye)  Patient location during evaluation: Short Stay Anesthesia Type: MAC Level of consciousness: awake and alert and patient cooperative Pain management: pain level controlled Vital Signs Assessment: post-procedure vital signs reviewed and stable Respiratory status: spontaneous breathing, nonlabored ventilation and respiratory function stable Cardiovascular status: blood pressure returned to baseline Postop Assessment: no apparent nausea or vomiting Anesthetic complications: no     Last Vitals:  Vitals:   04/03/18 0651  BP: 117/66  Pulse: 65  Resp: 18  Temp: 36.6 C  SpO2: 95%    Last Pain:  Vitals:   04/03/18 0651  TempSrc: Oral  PainSc: 0-No pain                 Analynn Daum J

## 2018-04-03 NOTE — H&P (Signed)
The H and P was reviewed and updated. The patient was examined.  No changes were found after exam.  The surgical eye was marked.  

## 2018-04-03 NOTE — Anesthesia Preprocedure Evaluation (Signed)
Anesthesia Evaluation  Patient identified by MRN, date of birth, ID band Patient awake    Reviewed: Allergy & Precautions, H&P , NPO status , Patient's Chart, lab work & pertinent test results, reviewed documented beta blocker date and time   Airway Mallampati: II  TM Distance: >3 FB Neck ROM: full    Dental no notable dental hx.    Pulmonary neg pulmonary ROS,    Pulmonary exam normal breath sounds clear to auscultation       Cardiovascular Exercise Tolerance: Good hypertension, negative cardio ROS   Rhythm:regular Rate:Normal     Neuro/Psych negative neurological ROS  negative psych ROS   GI/Hepatic negative GI ROS, Neg liver ROS,   Endo/Other  negative endocrine ROSdiabetes  Renal/GU negative Renal ROS  negative genitourinary   Musculoskeletal   Abdominal   Peds  Hematology negative hematology ROS (+)   Anesthesia Other Findings   Reproductive/Obstetrics negative OB ROS                             Anesthesia Physical Anesthesia Plan  ASA: II  Anesthesia Plan: MAC   Post-op Pain Management:    Induction:   PONV Risk Score and Plan:   Airway Management Planned:   Additional Equipment:   Intra-op Plan:   Post-operative Plan:   Informed Consent: I have reviewed the patients History and Physical, chart, labs and discussed the procedure including the risks, benefits and alternatives for the proposed anesthesia with the patient or authorized representative who has indicated his/her understanding and acceptance.     Dental Advisory Given  Plan Discussed with: CRNA  Anesthesia Plan Comments:         Anesthesia Quick Evaluation  

## 2018-04-03 NOTE — Transfer of Care (Signed)
Immediate Anesthesia Transfer of Care Note  Patient: Alicia Dudley  Procedure(s) Performed: CATARACT EXTRACTION PHACO AND INTRAOCULAR LENS PLACEMENT (IOC) (Right Eye)  Patient Location: PACU  Anesthesia Type:MAC  Level of Consciousness: awake and patient cooperative  Airway & Oxygen Therapy: Patient Spontanous Breathing  Post-op Assessment: Report given to RN, Post -op Vital signs reviewed and stable and Patient moving all extremities  Post vital signs: Reviewed and stable  Last Vitals:  Vitals Value Taken Time  BP    Temp    Pulse    Resp    SpO2      Last Pain:  Vitals:   04/03/18 0651  TempSrc: Oral  PainSc: 0-No pain      Patients Stated Pain Goal: 5 (35/45/62 5638)  Complications: No apparent anesthesia complications

## 2018-04-03 NOTE — Discharge Instructions (Signed)
Please discharge patient when stable, will follow up today with Dr. Altagracia Rone at the [facility] office immediately following discharge.  Leave shield in place until visit.  All paperwork with discharge instructions will be given at the office. ° °

## 2018-04-04 ENCOUNTER — Encounter (HOSPITAL_COMMUNITY): Payer: Self-pay | Admitting: Ophthalmology

## 2018-07-10 DIAGNOSIS — E876 Hypokalemia: Secondary | ICD-10-CM | POA: Diagnosis not present

## 2018-07-10 DIAGNOSIS — E119 Type 2 diabetes mellitus without complications: Secondary | ICD-10-CM | POA: Diagnosis not present

## 2018-07-10 DIAGNOSIS — I10 Essential (primary) hypertension: Secondary | ICD-10-CM | POA: Diagnosis not present

## 2018-07-10 DIAGNOSIS — K219 Gastro-esophageal reflux disease without esophagitis: Secondary | ICD-10-CM | POA: Diagnosis not present

## 2018-07-10 DIAGNOSIS — E782 Mixed hyperlipidemia: Secondary | ICD-10-CM | POA: Diagnosis not present

## 2018-07-18 DIAGNOSIS — K219 Gastro-esophageal reflux disease without esophagitis: Secondary | ICD-10-CM | POA: Diagnosis not present

## 2018-07-18 DIAGNOSIS — Z0001 Encounter for general adult medical examination with abnormal findings: Secondary | ICD-10-CM | POA: Diagnosis not present

## 2018-07-18 DIAGNOSIS — E119 Type 2 diabetes mellitus without complications: Secondary | ICD-10-CM | POA: Diagnosis not present

## 2018-07-18 DIAGNOSIS — Z6829 Body mass index (BMI) 29.0-29.9, adult: Secondary | ICD-10-CM | POA: Diagnosis not present

## 2018-07-18 DIAGNOSIS — I10 Essential (primary) hypertension: Secondary | ICD-10-CM | POA: Diagnosis not present

## 2018-07-18 DIAGNOSIS — K5730 Diverticulosis of large intestine without perforation or abscess without bleeding: Secondary | ICD-10-CM | POA: Diagnosis not present

## 2018-07-18 DIAGNOSIS — K635 Polyp of colon: Secondary | ICD-10-CM | POA: Diagnosis not present

## 2018-07-18 DIAGNOSIS — E782 Mixed hyperlipidemia: Secondary | ICD-10-CM | POA: Diagnosis not present

## 2018-09-19 DIAGNOSIS — Z23 Encounter for immunization: Secondary | ICD-10-CM | POA: Diagnosis not present

## 2018-12-04 DIAGNOSIS — H35373 Puckering of macula, bilateral: Secondary | ICD-10-CM | POA: Diagnosis not present

## 2018-12-04 DIAGNOSIS — H26492 Other secondary cataract, left eye: Secondary | ICD-10-CM | POA: Diagnosis not present

## 2018-12-09 DIAGNOSIS — H26491 Other secondary cataract, right eye: Secondary | ICD-10-CM | POA: Diagnosis not present

## 2019-01-15 DIAGNOSIS — I700 Atherosclerosis of aorta: Secondary | ICD-10-CM | POA: Diagnosis not present

## 2019-01-15 DIAGNOSIS — E876 Hypokalemia: Secondary | ICD-10-CM | POA: Diagnosis not present

## 2019-01-15 DIAGNOSIS — E782 Mixed hyperlipidemia: Secondary | ICD-10-CM | POA: Diagnosis not present

## 2019-01-15 DIAGNOSIS — K219 Gastro-esophageal reflux disease without esophagitis: Secondary | ICD-10-CM | POA: Diagnosis not present

## 2019-01-15 DIAGNOSIS — E119 Type 2 diabetes mellitus without complications: Secondary | ICD-10-CM | POA: Diagnosis not present

## 2019-01-15 DIAGNOSIS — I10 Essential (primary) hypertension: Secondary | ICD-10-CM | POA: Diagnosis not present

## 2019-01-22 DIAGNOSIS — H11002 Unspecified pterygium of left eye: Secondary | ICD-10-CM | POA: Diagnosis not present

## 2019-01-22 DIAGNOSIS — I10 Essential (primary) hypertension: Secondary | ICD-10-CM | POA: Diagnosis not present

## 2019-01-22 DIAGNOSIS — H269 Unspecified cataract: Secondary | ICD-10-CM | POA: Diagnosis not present

## 2019-01-22 DIAGNOSIS — K635 Polyp of colon: Secondary | ICD-10-CM | POA: Diagnosis not present

## 2019-01-22 DIAGNOSIS — H409 Unspecified glaucoma: Secondary | ICD-10-CM | POA: Diagnosis not present

## 2019-01-22 DIAGNOSIS — I700 Atherosclerosis of aorta: Secondary | ICD-10-CM | POA: Diagnosis not present

## 2019-01-22 DIAGNOSIS — E1169 Type 2 diabetes mellitus with other specified complication: Secondary | ICD-10-CM | POA: Diagnosis not present

## 2019-01-22 DIAGNOSIS — E785 Hyperlipidemia, unspecified: Secondary | ICD-10-CM | POA: Diagnosis not present

## 2019-01-28 DIAGNOSIS — Z299 Encounter for prophylactic measures, unspecified: Secondary | ICD-10-CM | POA: Diagnosis not present

## 2019-01-28 DIAGNOSIS — I10 Essential (primary) hypertension: Secondary | ICD-10-CM | POA: Diagnosis not present

## 2019-01-28 DIAGNOSIS — Z6829 Body mass index (BMI) 29.0-29.9, adult: Secondary | ICD-10-CM | POA: Diagnosis not present

## 2019-01-28 DIAGNOSIS — B079 Viral wart, unspecified: Secondary | ICD-10-CM | POA: Diagnosis not present

## 2019-02-25 DIAGNOSIS — Q897 Multiple congenital malformations, not elsewhere classified: Secondary | ICD-10-CM | POA: Diagnosis not present

## 2019-02-25 DIAGNOSIS — H43823 Vitreomacular adhesion, bilateral: Secondary | ICD-10-CM | POA: Diagnosis not present

## 2019-02-25 DIAGNOSIS — H04123 Dry eye syndrome of bilateral lacrimal glands: Secondary | ICD-10-CM | POA: Diagnosis not present

## 2019-03-19 ENCOUNTER — Encounter: Payer: Medicare Other | Admitting: Gynecology

## 2019-04-22 DIAGNOSIS — H43823 Vitreomacular adhesion, bilateral: Secondary | ICD-10-CM | POA: Diagnosis not present

## 2019-06-04 DIAGNOSIS — H11042 Peripheral pterygium, stationary, left eye: Secondary | ICD-10-CM | POA: Diagnosis not present

## 2019-06-04 DIAGNOSIS — H35343 Macular cyst, hole, or pseudohole, bilateral: Secondary | ICD-10-CM | POA: Diagnosis not present

## 2019-06-11 DIAGNOSIS — H43813 Vitreous degeneration, bilateral: Secondary | ICD-10-CM | POA: Diagnosis not present

## 2019-06-11 DIAGNOSIS — H35343 Macular cyst, hole, or pseudohole, bilateral: Secondary | ICD-10-CM | POA: Diagnosis not present

## 2019-06-11 DIAGNOSIS — H43823 Vitreomacular adhesion, bilateral: Secondary | ICD-10-CM | POA: Diagnosis not present

## 2019-07-14 DIAGNOSIS — H35341 Macular cyst, hole, or pseudohole, right eye: Secondary | ICD-10-CM | POA: Diagnosis not present

## 2019-07-14 DIAGNOSIS — H33321 Round hole, right eye: Secondary | ICD-10-CM | POA: Diagnosis not present

## 2019-07-15 DIAGNOSIS — H43821 Vitreomacular adhesion, right eye: Secondary | ICD-10-CM | POA: Diagnosis not present

## 2019-07-15 DIAGNOSIS — H35341 Macular cyst, hole, or pseudohole, right eye: Secondary | ICD-10-CM | POA: Diagnosis not present

## 2019-07-23 DIAGNOSIS — H35343 Macular cyst, hole, or pseudohole, bilateral: Secondary | ICD-10-CM | POA: Diagnosis not present

## 2019-07-23 DIAGNOSIS — H43822 Vitreomacular adhesion, left eye: Secondary | ICD-10-CM | POA: Diagnosis not present

## 2019-08-20 DIAGNOSIS — H35372 Puckering of macula, left eye: Secondary | ICD-10-CM | POA: Diagnosis not present

## 2019-08-20 DIAGNOSIS — H35343 Macular cyst, hole, or pseudohole, bilateral: Secondary | ICD-10-CM | POA: Diagnosis not present

## 2019-08-20 DIAGNOSIS — H43821 Vitreomacular adhesion, right eye: Secondary | ICD-10-CM | POA: Diagnosis not present

## 2019-09-16 ENCOUNTER — Encounter: Payer: Self-pay | Admitting: Gynecology

## 2019-09-18 DIAGNOSIS — E782 Mixed hyperlipidemia: Secondary | ICD-10-CM | POA: Diagnosis not present

## 2019-09-18 DIAGNOSIS — E1169 Type 2 diabetes mellitus with other specified complication: Secondary | ICD-10-CM | POA: Diagnosis not present

## 2019-09-18 DIAGNOSIS — E876 Hypokalemia: Secondary | ICD-10-CM | POA: Diagnosis not present

## 2019-09-18 DIAGNOSIS — E785 Hyperlipidemia, unspecified: Secondary | ICD-10-CM | POA: Diagnosis not present

## 2019-09-24 DIAGNOSIS — Z23 Encounter for immunization: Secondary | ICD-10-CM | POA: Diagnosis not present

## 2019-09-24 DIAGNOSIS — E876 Hypokalemia: Secondary | ICD-10-CM | POA: Diagnosis not present

## 2019-09-24 DIAGNOSIS — I10 Essential (primary) hypertension: Secondary | ICD-10-CM | POA: Diagnosis not present

## 2019-09-24 DIAGNOSIS — I700 Atherosclerosis of aorta: Secondary | ICD-10-CM | POA: Diagnosis not present

## 2019-09-24 DIAGNOSIS — K219 Gastro-esophageal reflux disease without esophagitis: Secondary | ICD-10-CM | POA: Diagnosis not present

## 2019-09-24 DIAGNOSIS — Z6829 Body mass index (BMI) 29.0-29.9, adult: Secondary | ICD-10-CM | POA: Diagnosis not present

## 2019-09-24 DIAGNOSIS — E785 Hyperlipidemia, unspecified: Secondary | ICD-10-CM | POA: Diagnosis not present

## 2019-09-24 DIAGNOSIS — E1169 Type 2 diabetes mellitus with other specified complication: Secondary | ICD-10-CM | POA: Diagnosis not present

## 2019-11-04 DIAGNOSIS — Z6829 Body mass index (BMI) 29.0-29.9, adult: Secondary | ICD-10-CM | POA: Diagnosis not present

## 2019-11-04 DIAGNOSIS — Z299 Encounter for prophylactic measures, unspecified: Secondary | ICD-10-CM | POA: Diagnosis not present

## 2019-11-04 DIAGNOSIS — M25511 Pain in right shoulder: Secondary | ICD-10-CM | POA: Diagnosis not present

## 2019-11-04 DIAGNOSIS — E785 Hyperlipidemia, unspecified: Secondary | ICD-10-CM | POA: Diagnosis not present

## 2019-11-04 DIAGNOSIS — I10 Essential (primary) hypertension: Secondary | ICD-10-CM | POA: Diagnosis not present

## 2019-11-04 DIAGNOSIS — M25512 Pain in left shoulder: Secondary | ICD-10-CM | POA: Diagnosis not present

## 2019-11-04 DIAGNOSIS — E1165 Type 2 diabetes mellitus with hyperglycemia: Secondary | ICD-10-CM | POA: Diagnosis not present

## 2019-11-09 DIAGNOSIS — Z961 Presence of intraocular lens: Secondary | ICD-10-CM | POA: Diagnosis not present

## 2019-11-09 DIAGNOSIS — H35371 Puckering of macula, right eye: Secondary | ICD-10-CM | POA: Diagnosis not present

## 2019-11-09 DIAGNOSIS — H11042 Peripheral pterygium, stationary, left eye: Secondary | ICD-10-CM | POA: Diagnosis not present

## 2019-11-09 DIAGNOSIS — H35343 Macular cyst, hole, or pseudohole, bilateral: Secondary | ICD-10-CM | POA: Diagnosis not present

## 2019-12-18 DIAGNOSIS — Z23 Encounter for immunization: Secondary | ICD-10-CM | POA: Diagnosis not present

## 2019-12-28 DIAGNOSIS — H43823 Vitreomacular adhesion, bilateral: Secondary | ICD-10-CM | POA: Diagnosis not present

## 2019-12-28 DIAGNOSIS — Z9889 Other specified postprocedural states: Secondary | ICD-10-CM | POA: Diagnosis not present

## 2019-12-28 DIAGNOSIS — H524 Presbyopia: Secondary | ICD-10-CM | POA: Diagnosis not present

## 2019-12-28 DIAGNOSIS — H35373 Puckering of macula, bilateral: Secondary | ICD-10-CM | POA: Diagnosis not present

## 2019-12-28 DIAGNOSIS — E119 Type 2 diabetes mellitus without complications: Secondary | ICD-10-CM | POA: Diagnosis not present

## 2019-12-28 DIAGNOSIS — H40013 Open angle with borderline findings, low risk, bilateral: Secondary | ICD-10-CM | POA: Diagnosis not present

## 2019-12-28 DIAGNOSIS — Z961 Presence of intraocular lens: Secondary | ICD-10-CM | POA: Diagnosis not present

## 2019-12-28 DIAGNOSIS — H35343 Macular cyst, hole, or pseudohole, bilateral: Secondary | ICD-10-CM | POA: Diagnosis not present

## 2020-01-15 DIAGNOSIS — Z23 Encounter for immunization: Secondary | ICD-10-CM | POA: Diagnosis not present

## 2020-03-22 DIAGNOSIS — E1169 Type 2 diabetes mellitus with other specified complication: Secondary | ICD-10-CM | POA: Diagnosis not present

## 2020-03-22 DIAGNOSIS — I10 Essential (primary) hypertension: Secondary | ICD-10-CM | POA: Diagnosis not present

## 2020-03-22 DIAGNOSIS — K219 Gastro-esophageal reflux disease without esophagitis: Secondary | ICD-10-CM | POA: Diagnosis not present

## 2020-03-22 DIAGNOSIS — E782 Mixed hyperlipidemia: Secondary | ICD-10-CM | POA: Diagnosis not present

## 2020-03-22 DIAGNOSIS — E785 Hyperlipidemia, unspecified: Secondary | ICD-10-CM | POA: Diagnosis not present

## 2020-03-24 DIAGNOSIS — I700 Atherosclerosis of aorta: Secondary | ICD-10-CM | POA: Diagnosis not present

## 2020-03-24 DIAGNOSIS — M67814 Other specified disorders of tendon, left shoulder: Secondary | ICD-10-CM | POA: Diagnosis not present

## 2020-03-24 DIAGNOSIS — I10 Essential (primary) hypertension: Secondary | ICD-10-CM | POA: Diagnosis not present

## 2020-03-24 DIAGNOSIS — E1169 Type 2 diabetes mellitus with other specified complication: Secondary | ICD-10-CM | POA: Diagnosis not present

## 2020-03-24 DIAGNOSIS — M7542 Impingement syndrome of left shoulder: Secondary | ICD-10-CM | POA: Diagnosis not present

## 2020-03-24 DIAGNOSIS — Z6828 Body mass index (BMI) 28.0-28.9, adult: Secondary | ICD-10-CM | POA: Diagnosis not present

## 2020-03-24 DIAGNOSIS — K635 Polyp of colon: Secondary | ICD-10-CM | POA: Diagnosis not present

## 2020-03-24 DIAGNOSIS — E782 Mixed hyperlipidemia: Secondary | ICD-10-CM | POA: Diagnosis not present

## 2020-03-25 ENCOUNTER — Other Ambulatory Visit: Payer: Self-pay | Admitting: Family Medicine

## 2020-03-25 DIAGNOSIS — Z1231 Encounter for screening mammogram for malignant neoplasm of breast: Secondary | ICD-10-CM

## 2020-03-29 ENCOUNTER — Encounter (INDEPENDENT_AMBULATORY_CARE_PROVIDER_SITE_OTHER): Payer: Self-pay | Admitting: *Deleted

## 2020-04-01 ENCOUNTER — Ambulatory Visit: Payer: Medicare Other

## 2020-04-20 ENCOUNTER — Ambulatory Visit
Admission: RE | Admit: 2020-04-20 | Discharge: 2020-04-20 | Disposition: A | Discharge status: Home / Self Care | Payer: Medicare Other | Source: Ambulatory Visit | Attending: Family Medicine | Admitting: Family Medicine

## 2020-04-20 ENCOUNTER — Other Ambulatory Visit: Payer: Self-pay

## 2020-04-20 DIAGNOSIS — Z1231 Encounter for screening mammogram for malignant neoplasm of breast: Secondary | ICD-10-CM

## 2020-04-21 DIAGNOSIS — H35371 Puckering of macula, right eye: Secondary | ICD-10-CM | POA: Diagnosis not present

## 2020-04-21 DIAGNOSIS — H11042 Peripheral pterygium, stationary, left eye: Secondary | ICD-10-CM | POA: Diagnosis not present

## 2020-04-21 DIAGNOSIS — H35343 Macular cyst, hole, or pseudohole, bilateral: Secondary | ICD-10-CM | POA: Diagnosis not present

## 2020-04-21 DIAGNOSIS — Z961 Presence of intraocular lens: Secondary | ICD-10-CM | POA: Diagnosis not present

## 2020-06-15 ENCOUNTER — Other Ambulatory Visit (INDEPENDENT_AMBULATORY_CARE_PROVIDER_SITE_OTHER): Payer: Self-pay | Admitting: *Deleted

## 2020-06-21 ENCOUNTER — Other Ambulatory Visit (INDEPENDENT_AMBULATORY_CARE_PROVIDER_SITE_OTHER): Payer: Self-pay | Admitting: *Deleted

## 2020-06-21 DIAGNOSIS — Z86010 Personal history of colonic polyps: Secondary | ICD-10-CM

## 2020-08-17 ENCOUNTER — Telehealth (INDEPENDENT_AMBULATORY_CARE_PROVIDER_SITE_OTHER): Payer: Self-pay | Admitting: *Deleted

## 2020-08-17 ENCOUNTER — Encounter (INDEPENDENT_AMBULATORY_CARE_PROVIDER_SITE_OTHER): Payer: Self-pay | Admitting: *Deleted

## 2020-08-17 MED ORDER — PLENVU 140 G PO SOLR: 1.0000 | Freq: Once | ORAL | 0 refills | Status: AC

## 2020-08-17 NOTE — Telephone Encounter (Signed)
Referring MD/PCP: burdine   Procedure: tcs  Reason/Indication:  Hx polyps  Has patient had this procedure before?  Yes, 2016  If so, when, by whom and where?    Is there a family history of colon cancer?  no  Who?  What age when diagnosed?    Is patient diabetic?   yes      Does patient have prosthetic heart valve or mechanical valve?  no  Do you have a pacemaker/defibrillator?  no  Has patient ever had endocarditis/atrial fibrillation? no  Does patient use oxygen? no  Has patient had joint replacement within last 12 months?  no  Is patient constipated or do they take laxatives? no  Does patient have a history of alcohol/drug use?  no  Is patient on blood thinner such as Coumadin, Plavix and/or Aspirin? no  Medications: losartan 100 mg daily, amlodipine 5 mg daily, hctz 25 mg daily, metformin 500 mg daily, atorvastatin 10 mg 1 tab twice weekly  Allergies: augmentin, sulfur drugs, codeine, asa, lipitor  Medication Adjustment per Dr Rehman/Dr Jenetta Downer hold metformin evening before and morning of  Procedure date & time: 09/15/20

## 2020-08-17 NOTE — Telephone Encounter (Signed)
Patient needs Plenvu (copay card) ° °

## 2020-09-07 DIAGNOSIS — I700 Atherosclerosis of aorta: Secondary | ICD-10-CM | POA: Diagnosis not present

## 2020-09-07 DIAGNOSIS — K219 Gastro-esophageal reflux disease without esophagitis: Secondary | ICD-10-CM | POA: Diagnosis not present

## 2020-09-07 DIAGNOSIS — E1169 Type 2 diabetes mellitus with other specified complication: Secondary | ICD-10-CM | POA: Diagnosis not present

## 2020-09-07 DIAGNOSIS — I10 Essential (primary) hypertension: Secondary | ICD-10-CM | POA: Diagnosis not present

## 2020-09-07 DIAGNOSIS — E782 Mixed hyperlipidemia: Secondary | ICD-10-CM | POA: Diagnosis not present

## 2020-09-07 DIAGNOSIS — E785 Hyperlipidemia, unspecified: Secondary | ICD-10-CM | POA: Diagnosis not present

## 2020-09-13 ENCOUNTER — Other Ambulatory Visit (HOSPITAL_COMMUNITY)
Admission: RE | Admit: 2020-09-13 | Discharge: 2020-09-13 | Disposition: A | Discharge status: Home / Self Care | Payer: Medicare Other | Source: Ambulatory Visit | Attending: Internal Medicine | Admitting: Internal Medicine

## 2020-09-13 ENCOUNTER — Other Ambulatory Visit: Payer: Self-pay

## 2020-09-13 DIAGNOSIS — Z20822 Contact with and (suspected) exposure to covid-19: Secondary | ICD-10-CM | POA: Insufficient documentation

## 2020-09-13 DIAGNOSIS — Z01812 Encounter for preprocedural laboratory examination: Secondary | ICD-10-CM | POA: Diagnosis not present

## 2020-09-13 LAB — SARS CORONAVIRUS 2 (TAT 6-24 HRS): SARS Coronavirus 2: NEGATIVE

## 2020-09-15 ENCOUNTER — Other Ambulatory Visit: Payer: Self-pay

## 2020-09-15 ENCOUNTER — Ambulatory Visit (HOSPITAL_COMMUNITY)
Admission: RE | Admit: 2020-09-15 | Discharge: 2020-09-15 | Disposition: A | Discharge status: Home / Self Care | Payer: Medicare Other | Source: Ambulatory Visit | Attending: Internal Medicine | Admitting: Internal Medicine

## 2020-09-15 ENCOUNTER — Encounter (HOSPITAL_COMMUNITY): Payer: Self-pay | Admitting: Internal Medicine

## 2020-09-15 ENCOUNTER — Encounter (HOSPITAL_COMMUNITY)
Admission: RE | Disposition: A | Discharge status: Home / Self Care | Payer: Self-pay | Source: Ambulatory Visit | Attending: Internal Medicine

## 2020-09-15 DIAGNOSIS — H42 Glaucoma in diseases classified elsewhere: Secondary | ICD-10-CM | POA: Insufficient documentation

## 2020-09-15 DIAGNOSIS — Z86010 Personal history of colonic polyps: Secondary | ICD-10-CM | POA: Insufficient documentation

## 2020-09-15 DIAGNOSIS — Z800 Family history of malignant neoplasm of digestive organs: Secondary | ICD-10-CM | POA: Insufficient documentation

## 2020-09-15 DIAGNOSIS — Z7984 Long term (current) use of oral hypoglycemic drugs: Secondary | ICD-10-CM | POA: Diagnosis not present

## 2020-09-15 DIAGNOSIS — Z885 Allergy status to narcotic agent status: Secondary | ICD-10-CM | POA: Diagnosis not present

## 2020-09-15 DIAGNOSIS — Z79899 Other long term (current) drug therapy: Secondary | ICD-10-CM | POA: Insufficient documentation

## 2020-09-15 DIAGNOSIS — E1139 Type 2 diabetes mellitus with other diabetic ophthalmic complication: Secondary | ICD-10-CM | POA: Diagnosis not present

## 2020-09-15 DIAGNOSIS — Z1211 Encounter for screening for malignant neoplasm of colon: Secondary | ICD-10-CM | POA: Diagnosis not present

## 2020-09-15 DIAGNOSIS — E7800 Pure hypercholesterolemia, unspecified: Secondary | ICD-10-CM | POA: Insufficient documentation

## 2020-09-15 DIAGNOSIS — I10 Essential (primary) hypertension: Secondary | ICD-10-CM | POA: Diagnosis not present

## 2020-09-15 DIAGNOSIS — M8580 Other specified disorders of bone density and structure, unspecified site: Secondary | ICD-10-CM | POA: Diagnosis not present

## 2020-09-15 DIAGNOSIS — Z09 Encounter for follow-up examination after completed treatment for conditions other than malignant neoplasm: Secondary | ICD-10-CM | POA: Diagnosis not present

## 2020-09-15 DIAGNOSIS — K641 Second degree hemorrhoids: Secondary | ICD-10-CM | POA: Diagnosis not present

## 2020-09-15 DIAGNOSIS — D120 Benign neoplasm of cecum: Secondary | ICD-10-CM | POA: Insufficient documentation

## 2020-09-15 DIAGNOSIS — E119 Type 2 diabetes mellitus without complications: Secondary | ICD-10-CM | POA: Diagnosis not present

## 2020-09-15 DIAGNOSIS — Z886 Allergy status to analgesic agent status: Secondary | ICD-10-CM | POA: Diagnosis not present

## 2020-09-15 DIAGNOSIS — Z880 Allergy status to penicillin: Secondary | ICD-10-CM | POA: Diagnosis not present

## 2020-09-15 DIAGNOSIS — Z888 Allergy status to other drugs, medicaments and biological substances status: Secondary | ICD-10-CM | POA: Diagnosis not present

## 2020-09-15 DIAGNOSIS — K621 Rectal polyp: Secondary | ICD-10-CM | POA: Diagnosis not present

## 2020-09-15 DIAGNOSIS — Z882 Allergy status to sulfonamides status: Secondary | ICD-10-CM | POA: Insufficient documentation

## 2020-09-15 DIAGNOSIS — K5730 Diverticulosis of large intestine without perforation or abscess without bleeding: Secondary | ICD-10-CM | POA: Insufficient documentation

## 2020-09-15 HISTORY — PX: BIOPSY: SHX5522

## 2020-09-15 HISTORY — PX: POLYPECTOMY: SHX5525

## 2020-09-15 HISTORY — PX: COLONOSCOPY: SHX5424

## 2020-09-15 LAB — HM COLONOSCOPY

## 2020-09-15 LAB — GLUCOSE, CAPILLARY: Glucose-Capillary: 211 mg/dL — ABNORMAL HIGH (ref 70–99)

## 2020-09-15 SURGERY — COLONOSCOPY
Anesthesia: Moderate Sedation

## 2020-09-15 MED ORDER — SODIUM CHLORIDE 0.9 % IV SOLN: INTRAVENOUS | Status: DC

## 2020-09-15 MED ORDER — MEPERIDINE HCL 50 MG/ML IJ SOLN
INTRAMUSCULAR | Status: AC
  Filled 2020-09-15: qty 1

## 2020-09-15 MED ORDER — MIDAZOLAM HCL 5 MG/5ML IJ SOLN
INTRAMUSCULAR | Status: DC | PRN
  Administered 2020-09-15: 1 mg via INTRAVENOUS
  Administered 2020-09-15: 2 mg via INTRAVENOUS
  Administered 2020-09-15 (×3): 1 mg via INTRAVENOUS

## 2020-09-15 MED ORDER — STERILE WATER FOR IRRIGATION IR SOLN
Status: DC | PRN
  Administered 2020-09-15: 100 mL

## 2020-09-15 MED ORDER — MEPERIDINE HCL 50 MG/ML IJ SOLN
INTRAMUSCULAR | Status: DC | PRN
Start: 2020-09-15 — End: 2020-09-15
  Administered 2020-09-15 (×2): 25 mg

## 2020-09-15 MED ORDER — MIDAZOLAM HCL 5 MG/5ML IJ SOLN
INTRAMUSCULAR | Status: AC
  Filled 2020-09-15: qty 10

## 2020-09-15 NOTE — Op Note (Signed)
[facility] Patient Name: Alicia Dudley Procedure Date: 09/15/2020 7:12 AM MRN: [id number] Date of Birth: Jun 21, 1951 Attending MD: Hildred Laser , MD CSN: [id number] Age: 69 Admit Type: Outpatient Procedure:                Colonoscopy Indications:              High risk colon cancer surveillance: Personal                            history of colonic polyps Providers:                Hildred Laser, MD, Janeece Riggers, RN, Lambert Mody, Raphael Gibney, Technician Referring MD:             Curlene Labrum Medicines:                Meperidine 50 mg IV, Midazolam 6 mg IV Complications:            No immediate complications. Estimated Blood Loss:     Estimated blood loss was minimal. Procedure:                Pre-Anesthesia Assessment:                           - Prior to the procedure, a History and Physical                            was performed, and patient medications and                            allergies were reviewed. The patient's tolerance of                            previous anesthesia was also reviewed. The risks                            and benefits of the procedure and the sedation                            options and risks were discussed with the patient.                            All questions were answered, and informed consent                            was obtained. Prior Anticoagulants: The patient has                            taken no previous anticoagulant or antiplatelet                            agents. ASA Grade Assessment: II - A patient with  mild systemic disease. After reviewing the risks                            and benefits, the patient was deemed in                            satisfactory condition to undergo the procedure.                           After obtaining informed consent, the colonoscope                            was passed under direct vision. Throughout the                             procedure, the patient's blood pressure, pulse, and                            oxygen saturations were monitored continuously. The                            PCF-H190DL ([id number]) scope was introduced through                            the anus and advanced to the the cecum, identified                            by appendiceal orifice and ileocecal valve. The                            colonoscopy was technically difficult and complex                            due to multiple diverticula in the colon and                            restricted mobility of the colon. Successful                            completion of the procedure was aided by                            withdrawing the scope and replacing with the                            'babyscope'. The patient tolerated the procedure                            well. The quality of the bowel preparation was                            excellent. Scope In: 7:43:50 AM Scope Out: 8:13:31 AM Scope Withdrawal Time: 0 hours 10 minutes 46 seconds  Total Procedure Duration: 0 hours 29  minutes 41 seconds  Findings:      The perianal and digital rectal examinations were normal.      A 4 mm polyp was found in the cecum. The polyp was sessile. The polyp       was removed with a cold snare. Resection and retrieval were complete.       The pathology specimen was placed into Bottle Number 1.      A small polyp was found in the rectum. Biopsies were taken with a cold       forceps for histology. The pathology specimen was placed into Bottle       Number 1.      Multiple small and large-mouthed diverticula were found in the sigmoid       colon.      A single large-mouthed diverticulum was found in the hepatic flexure.      The retroflexed view of the distal rectum and anal verge was normal and       showed no anal or rectal abnormalities. Impression:               - One 4 mm polyp in the cecum, removed with a cold                             snare. Resected and retrieved.                           - One small polyp in the rectum. Biopsied.                           - Diverticulosis in the sigmoid colon.                           - Diverticulosis at the hepatic flexure. Moderate Sedation:      Moderate (conscious) sedation was administered by the endoscopy nurse       and supervised by the endoscopist. The following parameters were       monitored: oxygen saturation, heart rate, blood pressure, CO2       capnography and response to care. Total physician intraservice time was       34 minutes. Recommendation:           - Patient has a contact number available for                            emergencies. The signs and symptoms of potential                            delayed complications were discussed with the                            patient. Return to normal activities tomorrow.                            Written discharge instructions were provided to the                            patient.                           -  High fiber diet and diabetic (ADA) diet today.                           - Continue present medications.                           - No aspirin, ibuprofen, naproxen, or other                            non-steroidal anti-inflammatory drugs for 1 day.                           - Await pathology results.                           - Repeat colonoscopy in 5 years for surveillance. Procedure Code(s):        --- Professional ---                           [phone], Colonoscopy, flexible; with removal of                            tumor(s), polyp(s), or other lesion(s) by snare                            technique                           45380, 59, Colonoscopy, flexible; with biopsy,                            single or multiple                           99153, Moderate sedation; each additional 15                            minutes intraservice time                           G0500, Moderate sedation services  provided by the                            same physician or other qualified health care                            professional performing a gastrointestinal                            endoscopic service that sedation supports,                            requiring the presence of an independent trained                            observer to assist in the monitoring of the  patient's level of consciousness and physiological                            status; initial 15 minutes of intra-service time;                            patient age 29 years or older (additional time may                            be reported with [id number], as appropriate) Diagnosis Code(s):        --- Professional ---                           Z86.010, Personal history of colonic polyps                           K63.5, Polyp of colon                           K62.1, Rectal polyp                           K57.30, Diverticulosis of large intestine without                            perforation or abscess without bleeding CPT copyright 2019 [employer]. All rights reserved. The codes documented in this report are preliminary and upon coder review may  be revised to meet current compliance requirements. Hildred Laser, MD Hildred Laser, MD 09/15/2020 8:26:24 AM This report has been signed electronically. Number of Addenda: 0

## 2020-09-15 NOTE — Discharge Instructions (Signed)
Colon Polyps  Polyps are tissue growths inside the body. Polyps can grow in many places, including the large intestine (colon). A polyp may be a round bump or a mushroom-shaped growth. You could have one polyp or several. Most colon polyps are noncancerous (benign). However, some colon polyps can become cancerous over time. Finding and removing the polyps early can help prevent this. What are the causes? The exact cause of colon polyps is not known. What increases the risk? You are more likely to develop this condition if you:  Have a family history of colon cancer or colon polyps.  Are older than 55 or older than 45 if you are African American.  Have inflammatory bowel disease, such as ulcerative colitis or Crohn's disease.  Have certain hereditary conditions, such as: ? Familial adenomatous polyposis. ? Lynch syndrome. ? Turcot syndrome. ? Peutz-Jeghers syndrome.  Are overweight.  Smoke cigarettes.  Do not get enough exercise.  Drink too much alcohol.  Eat a diet that is high in fat and red meat and low in fiber.  Had childhood cancer that was treated with abdominal radiation. What are the signs or symptoms? Most polyps do not cause symptoms. If you have symptoms, they may include:  Blood coming from your rectum when having a bowel movement.  Blood in your stool. The stool may look dark red or black.  Abdominal pain.  A change in bowel habits, such as constipation or diarrhea. How is this diagnosed? This condition is diagnosed with a colonoscopy. This is a procedure in which a lighted, flexible scope is inserted into the anus and then passed into the colon to examine the area. Polyps are sometimes found when a colonoscopy is done as part of routine cancer screening tests. How is this treated? Treatment for this condition involves removing any polyps that are found. Most polyps can be removed during a colonoscopy. Those polyps will then be tested for cancer. Additional  treatment may be needed depending on the results of testing. Follow these instructions at home: Lifestyle  Maintain a healthy weight, or lose weight if recommended by your health care provider.  Exercise every day or as told by your health care provider.  Do not use any products that contain nicotine or tobacco, such as cigarettes and e-cigarettes. If you need help quitting, ask your health care provider.  If you drink alcohol, limit how much you have: ? 0-1 drink a day for women. ? 0-2 drinks a day for men.  Be aware of how much alcohol is in your drink. In the [place] one drink equals one 12 oz bottle of beer (355 mL), one 5 oz glass of wine (148 mL), or one 1 oz shot of hard liquor (44 mL). Eating and drinking   Eat foods that are high in fiber, such as fruits, vegetables, and whole grains.  Eat foods that are high in calcium and vitamin D, such as milk, cheese, yogurt, eggs, liver, fish, and broccoli.  Limit foods that are high in fat, such as fried foods and desserts.  Limit the amount of red meat and processed meat you eat, such as hot dogs, sausage, bacon, and lunch meats. General instructions  Keep all follow-up visits as told by your health care provider. This is important. ? This includes having regularly scheduled colonoscopies. ? Talk to your health care provider about when you need a colonoscopy. Contact a health care provider if:  You have new or worsening bleeding during a bowel movement.  You  have new or increased blood in your stool.  You have a change in bowel habits.  You lose weight for no known reason. Summary  Polyps are tissue growths inside the body. Polyps can grow in many places, including the colon.  Most colon polyps are noncancerous (benign), but some can become cancerous over time.  This condition is diagnosed with a colonoscopy.  Treatment for this condition involves removing any polyps that are found. Most polyps can be removed during a  colonoscopy. This information is not intended to replace advice given to you by your health care provider. Make sure you discuss any questions you have with your health care provider. Document Revised: 03/13/2018 Document Reviewed: 03/13/2018 Elsevier Patient Education  [place]. Colonoscopy, Adult, Care After This sheet gives you information about how to care for yourself after your procedure. Your doctor may also give you more specific instructions. If you have problems or questions, call your doctor. What can I expect after the procedure? After the procedure, it is common to have:  A small amount of blood in your poop (stool) for 24 hours.  Some gas.  Mild cramping or bloating in your belly (abdomen). Follow these instructions at home: Eating and drinking   Drink enough fluid to keep your pee (urine) pale yellow.  Follow instructions from your doctor about what you cannot eat or drink.  Return to your normal diet as told by your doctor. Avoid heavy or fried foods that are hard to digest. Activity  Rest as told by your doctor.  Do not sit for a long time without moving. Get up to take short walks every 1-2 hours. This is important. Ask for help if you feel weak or unsteady.  Return to your normal activities as told by your doctor. Ask your doctor what activities are safe for you. To help cramping and bloating:   Try walking around.  Put heat on your belly as told by your doctor. Use the heat source that your doctor recommends, such as a moist heat pack or a heating pad. ? Put a towel between your skin and the heat source. ? Leave the heat on for 20-30 minutes. ? Remove the heat if your skin turns bright red. This is very important if you are unable to feel pain, heat, or cold. You may have a greater risk of getting burned. General instructions  For the first 24 hours after the procedure: ? Do not drive or use machinery. ? Do not sign important documents. ? Do  not drink alcohol. ? Do your daily activities more slowly than normal. ? Eat foods that are soft and easy to digest.  Take over-the-counter or prescription medicines only as told by your doctor.  Keep all follow-up visits as told by your doctor. This is important. Contact a doctor if:  You have blood in your poop 2-3 days after the procedure. Get help right away if:  You have more than a small amount of blood in your poop.  You see large clumps of tissue (blood clots) in your poop.  Your belly is swollen.  You feel like you may vomit (nauseous).  You vomit.  You have a fever.  You have belly pain that gets worse, and medicine does not help your pain. Summary  After the procedure, it is common to have a small amount of blood in your poop. You may also have mild cramping and bloating in your belly.  For the first 24 hours after the  procedure, do not drive or use machinery, do not sign important documents, and do not drink alcohol.  Get help right away if you have a lot of blood in your poop, feel like you may vomit, have a fever, or have more belly pain. This information is not intended to replace advice given to you by your health care provider. Make sure you discuss any questions you have with your health care provider. Document Revised: 06/22/2019 Document Reviewed: 06/22/2019 Elsevier Patient Education  [place].   Diverticulosis  Diverticulosis is a condition that develops when small pouches (diverticula) form in the wall of the large intestine (colon). The colon is where water is absorbed and stool (feces) is formed. The pouches form when the inside layer of the colon pushes through weak spots in the outer layers of the colon. You may have a few pouches or many of them. The pouches usually do not cause problems unless they become inflamed or infected. When this happens, the condition is called diverticulitis. What are the causes? The cause of this condition is  not known. What increases the risk? The following factors may make you more likely to develop this condition:  Being older than age 56. Your risk for this condition increases with age. Diverticulosis is rare among people younger than age 82. By age 10, many people have it.  Eating a low-fiber diet.  Having frequent constipation.  Being overweight.  Not getting enough exercise.  Smoking.  Taking over-the-counter pain medicines, like aspirin and ibuprofen.  Having a family history of diverticulosis. What are the signs or symptoms? In most people, there are no symptoms of this condition. If you do have symptoms, they may include:  Bloating.  Cramps in the abdomen.  Constipation or diarrhea.  Pain in the lower left side of the abdomen. How is this diagnosed? Because diverticulosis usually has no symptoms, it is most often diagnosed during an exam for other colon problems. The condition may be diagnosed by:  Using a flexible scope to examine the colon (colonoscopy).  Taking an X-ray of the colon after dye has been put into the colon (barium enema).  Having a CT scan. How is this treated? You may not need treatment for this condition. Your health care provider may recommend treatment to prevent problems. You may need treatment if you have symptoms or if you previously had diverticulitis. Treatment may include:  Eating a high-fiber diet.  Taking a fiber supplement.  Taking a live bacteria supplement (probiotic).  Taking medicine to relax your colon. Follow these instructions at home: Medicines  Take over-the-counter and prescription medicines only as told by your health care provider.  If told by your health care provider, take a fiber supplement or probiotic. Constipation prevention Your condition may cause constipation. To prevent or treat constipation, you may need to:  Drink enough fluid to keep your urine pale yellow.  Take over-the-counter or prescription  medicines.  Eat foods that are high in fiber, such as beans, whole grains, and fresh fruits and vegetables.  Limit foods that are high in fat and processed sugars, such as fried or sweet foods.  General instructions  Try not to strain when you have a bowel movement.  Keep all follow-up visits as told by your health care provider. This is important. Contact a health care provider if you:  Have pain in your abdomen.  Have bloating.  Have cramps.  Have not had a bowel movement in 3 days. Get help right away  if:  Your pain gets worse.  Your bloating becomes very bad.  You have a fever or chills, and your symptoms suddenly get worse.  You vomit.  You have bowel movements that are bloody or black.  You have bleeding from your rectum. Summary  Diverticulosis is a condition that develops when small pouches (diverticula) form in the wall of the large intestine (colon).  You may have a few pouches or many of them.  This condition is most often diagnosed during an exam for other colon problems.  Treatment may include increasing the fiber in your diet, taking supplements, or taking medicines. This information is not intended to replace advice given to you by your health care provider. Make sure you discuss any questions you have with your health care provider. Document Revised: 06/25/2019 Document Reviewed: 06/25/2019 Elsevier Patient Education  [place].   No aspirin or NSAIDs for 24 hours. Resume usual medications as before. Modified carb high-fiber diet. No driving for 24 hours. Physician will call with biopsy results.

## 2020-09-15 NOTE — H&P (Signed)
Alicia Dudley is an 69 y.o. female.   Chief Complaint: Patient is here for colonoscopy. HPI: Patient is 69 year old Caucasian female who has history of colonic adenomas and is here for surveillance colonoscopy.  She denies abdominal pain change in bowel habits or rectal bleeding.  She has had 2 colonoscopies before.  She had adenomas on her first colonoscopy 10 years ago when she had 4 polyps removed on her last exam in 1 or 2 were adenomatous. History significant for CRC and sister who was in late 20s at the time of diagnosis.  Past Medical History:  Diagnosis Date  . Diabetes mellitus without complication (HCC)    Type 2  . Elevated cholesterol   . Glaucoma   . Hypertension   . Osteopenia 04/2016   T score -1.2    Past Surgical History:  Procedure Laterality Date  . ABDOMINAL HYSTERECTOMY  age 65   TAH BSO for leiomyomata  . BREAST EXCISIONAL BIOPSY Left   . BREAST SURGERY Left    Biopsy  . CATARACT EXTRACTION W/PHACO Left 01/24/2018   Procedure: CATARACT EXTRACTION PHACO AND INTRAOCULAR LENS PLACEMENT LEFT EYE;  Surgeon: Baruch Goldmann, MD;  Location: [facility];  Service: Ophthalmology;  Laterality: Left;  CDE: 1.92  . CATARACT EXTRACTION W/PHACO Right 04/03/2018   Procedure: CATARACT EXTRACTION PHACO AND INTRAOCULAR LENS PLACEMENT (IOC);  Surgeon: Baruch Goldmann, MD;  Location: [facility];  Service: Ophthalmology;  Laterality: Right;  CDE: 4.01  . CHOLECYSTECTOMY    . EYE SURGERY     lasr surgery for glaucoma  . OOPHORECTOMY     BSO  . PTERYGIUM EXCISION Left 03/01/2016   Procedure: PTERYGIUM EXCISION WITH PLACEMENT OF AMNIOTIC MEMBRANE GRAFT;  Surgeon: Baruch Goldmann, MD;  Location: [facility];  Service: Ophthalmology;  Laterality: Left;    Family History  Problem Relation Age of Onset  . Breast cancer Mother 82  . Stroke Mother   . Cancer Sister        Lung/Bone  . Kidney disease Brother   . Stroke Maternal Grandmother   . Heart attack Maternal Grandfather   . Cancer Sister         Rectal  . Diabetes Sister   . Hypertension Sister   . Heart disease Sister   . Heart attack Sister   . Rectal cancer Sister    Social History:  reports that she has never smoked. She has never used smokeless tobacco. She reports that she does not drink alcohol and does not use drugs.  Allergies:  Allergies  Allergen Reactions  . Aspirin Itching  . Augmentin [Amoxicillin-Pot Clavulanate] Nausea And Vomiting    Has patient had a PCN reaction causing immediate rash, facial/tongue/throat swelling, SOB or lightheadedness with hypotension: No Has patient had a PCN reaction causing severe rash involving mucus membranes or skin necrosis: No Has patient had a PCN reaction that required hospitalization: No Has patient had a PCN reaction occurring within the last 10 years: Unknown If all of the above answers are "NO", then may proceed with Cephalosporin use.   . Codeine Nausea Only  . Morphine And Related Nausea And Vomiting  . Silvadene [Silver Sulfadiazine] Other (See Comments)    Cellulitis  . Sulfonamide Derivatives Hives    Medications Prior to Admission  Medication Sig Dispense Refill  . amLODipine (NORVASC) 5 MG tablet Take 5 mg by mouth daily.   6  . atorvastatin (LIPITOR) 10 MG tablet Take 10 mg by mouth 2 (two) times a week.    Marland Kitchen  carboxymethylcellulose (REFRESH TEARS) 0.5 % SOLN Place 1-2 drops into both eyes daily as needed (for dry eyes).     . fluticasone (FLONASE) 50 MCG/ACT nasal spray Place 1 spray into both nostrils daily as needed for allergies or rhinitis.    . hydrochlorothiazide (HYDRODIURIL) 25 MG tablet Take 25 mg by mouth daily.    Marland Kitchen losartan (COZAAR) 100 MG tablet Take 100 mg by mouth daily.    . metFORMIN (GLUCOPHAGE) 500 MG tablet Take 500 mg by mouth daily.    . Multiple Vitamins-Minerals (MULTIVITAMIN WITH MINERALS) tablet Take 1 tablet by mouth daily. One a day +      Results for orders placed or performed during the hospital encounter of 09/15/20 (from the  past 48 hour(s))  Glucose, capillary     Status: Abnormal   Collection Time: 09/15/20  6:55 AM  Result Value Ref Range   Glucose-Capillary 211 (H) 70 - 99 mg/dL    Comment: Glucose reference range applies only to samples taken after fasting for at least 8 hours.   No results found.  Review of Systems  Blood pressure 117/69, pulse 70, temperature 99.2 F (37.3 C), temperature source Oral, resp. rate 17, height 4\' 10"  (1.473 m), weight 61.2 kg, SpO2 95 %. Physical Exam HENT:     Mouth/Throat:     Mouth: Mucous membranes are moist.     Pharynx: Oropharynx is clear.  Eyes:     General: No scleral icterus.    Conjunctiva/sclera: Conjunctivae normal.  Cardiovascular:     Rate and Rhythm: Normal rate and regular rhythm.     Heart sounds: Normal heart sounds. No murmur heard.   Pulmonary:     Effort: Pulmonary effort is normal.     Breath sounds: Normal breath sounds.  Abdominal:     Comments: Abdomen is symmetrical with Pfannenstiel scar.  Abdomen is soft and nontender with no organomegaly or masses.  Musculoskeletal:        General: No swelling.     Cervical back: Neck supple.  Lymphadenopathy:     Cervical: No cervical adenopathy.  Skin:    General: Skin is warm and dry.  Neurological:     Mental Status: She is alert.      Assessment/Plan  History of colonic adenomas. Surveillance colonoscopy.  Alicia Laser, MD 09/15/2020, 7:34 AM

## 2020-09-16 LAB — SURGICAL PATHOLOGY

## 2020-09-20 DIAGNOSIS — I10 Essential (primary) hypertension: Secondary | ICD-10-CM | POA: Diagnosis not present

## 2020-09-20 DIAGNOSIS — D126 Benign neoplasm of colon, unspecified: Secondary | ICD-10-CM | POA: Diagnosis not present

## 2020-09-20 DIAGNOSIS — I700 Atherosclerosis of aorta: Secondary | ICD-10-CM | POA: Diagnosis not present

## 2020-09-20 DIAGNOSIS — Z1389 Encounter for screening for other disorder: Secondary | ICD-10-CM | POA: Diagnosis not present

## 2020-09-20 DIAGNOSIS — Z0001 Encounter for general adult medical examination with abnormal findings: Secondary | ICD-10-CM | POA: Diagnosis not present

## 2020-09-20 DIAGNOSIS — Z1331 Encounter for screening for depression: Secondary | ICD-10-CM | POA: Diagnosis not present

## 2020-09-20 DIAGNOSIS — Z23 Encounter for immunization: Secondary | ICD-10-CM | POA: Diagnosis not present

## 2020-09-20 DIAGNOSIS — E1169 Type 2 diabetes mellitus with other specified complication: Secondary | ICD-10-CM | POA: Diagnosis not present

## 2020-09-21 ENCOUNTER — Encounter (HOSPITAL_COMMUNITY): Payer: Self-pay | Admitting: Internal Medicine

## 2020-10-07 DIAGNOSIS — Z23 Encounter for immunization: Secondary | ICD-10-CM | POA: Diagnosis not present

## 2020-11-14 ENCOUNTER — Encounter (INDEPENDENT_AMBULATORY_CARE_PROVIDER_SITE_OTHER): Payer: Self-pay | Admitting: *Deleted

## 2020-12-28 DIAGNOSIS — R059 Cough, unspecified: Secondary | ICD-10-CM | POA: Diagnosis not present

## 2021-01-30 DIAGNOSIS — Z961 Presence of intraocular lens: Secondary | ICD-10-CM | POA: Diagnosis not present

## 2021-01-30 DIAGNOSIS — H35371 Puckering of macula, right eye: Secondary | ICD-10-CM | POA: Diagnosis not present

## 2021-01-30 DIAGNOSIS — H35343 Macular cyst, hole, or pseudohole, bilateral: Secondary | ICD-10-CM | POA: Diagnosis not present

## 2021-03-14 DIAGNOSIS — K219 Gastro-esophageal reflux disease without esophagitis: Secondary | ICD-10-CM | POA: Diagnosis not present

## 2021-03-14 DIAGNOSIS — Z1329 Encounter for screening for other suspected endocrine disorder: Secondary | ICD-10-CM | POA: Diagnosis not present

## 2021-03-14 DIAGNOSIS — E1169 Type 2 diabetes mellitus with other specified complication: Secondary | ICD-10-CM | POA: Diagnosis not present

## 2021-03-14 DIAGNOSIS — E782 Mixed hyperlipidemia: Secondary | ICD-10-CM | POA: Diagnosis not present

## 2021-03-14 DIAGNOSIS — I10 Essential (primary) hypertension: Secondary | ICD-10-CM | POA: Diagnosis not present

## 2021-03-14 DIAGNOSIS — E7849 Other hyperlipidemia: Secondary | ICD-10-CM | POA: Diagnosis not present

## 2021-03-16 DIAGNOSIS — Z6828 Body mass index (BMI) 28.0-28.9, adult: Secondary | ICD-10-CM | POA: Diagnosis not present

## 2021-03-16 DIAGNOSIS — E1169 Type 2 diabetes mellitus with other specified complication: Secondary | ICD-10-CM | POA: Diagnosis not present

## 2021-03-16 DIAGNOSIS — I10 Essential (primary) hypertension: Secondary | ICD-10-CM | POA: Diagnosis not present

## 2021-03-16 DIAGNOSIS — J302 Other seasonal allergic rhinitis: Secondary | ICD-10-CM | POA: Diagnosis not present

## 2021-03-16 DIAGNOSIS — E7849 Other hyperlipidemia: Secondary | ICD-10-CM | POA: Diagnosis not present

## 2021-03-16 DIAGNOSIS — E876 Hypokalemia: Secondary | ICD-10-CM | POA: Diagnosis not present

## 2021-06-29 DIAGNOSIS — Z961 Presence of intraocular lens: Secondary | ICD-10-CM | POA: Diagnosis not present

## 2021-06-29 DIAGNOSIS — H524 Presbyopia: Secondary | ICD-10-CM | POA: Diagnosis not present

## 2021-06-29 DIAGNOSIS — H40013 Open angle with borderline findings, low risk, bilateral: Secondary | ICD-10-CM | POA: Diagnosis not present

## 2021-06-29 DIAGNOSIS — E119 Type 2 diabetes mellitus without complications: Secondary | ICD-10-CM | POA: Diagnosis not present

## 2021-06-29 DIAGNOSIS — Z7984 Long term (current) use of oral hypoglycemic drugs: Secondary | ICD-10-CM | POA: Diagnosis not present

## 2021-08-05 DIAGNOSIS — J309 Allergic rhinitis, unspecified: Secondary | ICD-10-CM | POA: Diagnosis not present

## 2021-08-28 ENCOUNTER — Other Ambulatory Visit: Payer: Self-pay | Admitting: Family Medicine

## 2021-08-28 ENCOUNTER — Other Ambulatory Visit: Payer: Self-pay

## 2021-08-28 ENCOUNTER — Ambulatory Visit
Admission: RE | Admit: 2021-08-28 | Discharge: 2021-08-28 | Disposition: A | Discharge status: Home / Self Care | Payer: Medicare Other | Source: Ambulatory Visit | Attending: Family Medicine | Admitting: Family Medicine

## 2021-08-28 DIAGNOSIS — Z1231 Encounter for screening mammogram for malignant neoplasm of breast: Secondary | ICD-10-CM | POA: Diagnosis not present

## 2021-08-28 DIAGNOSIS — H11042 Peripheral pterygium, stationary, left eye: Secondary | ICD-10-CM | POA: Diagnosis not present

## 2021-08-28 DIAGNOSIS — H35343 Macular cyst, hole, or pseudohole, bilateral: Secondary | ICD-10-CM | POA: Diagnosis not present

## 2021-08-28 DIAGNOSIS — H35371 Puckering of macula, right eye: Secondary | ICD-10-CM | POA: Diagnosis not present

## 2021-08-28 DIAGNOSIS — H40013 Open angle with borderline findings, low risk, bilateral: Secondary | ICD-10-CM | POA: Diagnosis not present

## 2021-09-19 DIAGNOSIS — E782 Mixed hyperlipidemia: Secondary | ICD-10-CM | POA: Diagnosis not present

## 2021-09-19 DIAGNOSIS — E7849 Other hyperlipidemia: Secondary | ICD-10-CM | POA: Diagnosis not present

## 2021-09-19 DIAGNOSIS — E1159 Type 2 diabetes mellitus with other circulatory complications: Secondary | ICD-10-CM | POA: Diagnosis not present

## 2021-09-19 DIAGNOSIS — I10 Essential (primary) hypertension: Secondary | ICD-10-CM | POA: Diagnosis not present

## 2021-09-19 DIAGNOSIS — E559 Vitamin D deficiency, unspecified: Secondary | ICD-10-CM | POA: Diagnosis not present

## 2021-09-19 DIAGNOSIS — Z1159 Encounter for screening for other viral diseases: Secondary | ICD-10-CM | POA: Diagnosis not present

## 2021-09-19 DIAGNOSIS — E1169 Type 2 diabetes mellitus with other specified complication: Secondary | ICD-10-CM | POA: Diagnosis not present

## 2021-09-21 DIAGNOSIS — Z23 Encounter for immunization: Secondary | ICD-10-CM | POA: Diagnosis not present

## 2021-09-21 DIAGNOSIS — E876 Hypokalemia: Secondary | ICD-10-CM | POA: Diagnosis not present

## 2021-09-21 DIAGNOSIS — Z6827 Body mass index (BMI) 27.0-27.9, adult: Secondary | ICD-10-CM | POA: Diagnosis not present

## 2021-09-21 DIAGNOSIS — D126 Benign neoplasm of colon, unspecified: Secondary | ICD-10-CM | POA: Diagnosis not present

## 2021-09-21 DIAGNOSIS — Z0001 Encounter for general adult medical examination with abnormal findings: Secondary | ICD-10-CM | POA: Diagnosis not present

## 2021-09-21 DIAGNOSIS — E1169 Type 2 diabetes mellitus with other specified complication: Secondary | ICD-10-CM | POA: Diagnosis not present

## 2021-09-21 DIAGNOSIS — I10 Essential (primary) hypertension: Secondary | ICD-10-CM | POA: Diagnosis not present

## 2021-09-21 DIAGNOSIS — I700 Atherosclerosis of aorta: Secondary | ICD-10-CM | POA: Diagnosis not present

## 2021-09-26 DIAGNOSIS — E8940 Asymptomatic postprocedural ovarian failure: Secondary | ICD-10-CM | POA: Diagnosis not present

## 2021-10-03 DIAGNOSIS — Z23 Encounter for immunization: Secondary | ICD-10-CM | POA: Diagnosis not present

## 2021-11-03 DIAGNOSIS — R350 Frequency of micturition: Secondary | ICD-10-CM | POA: Diagnosis not present

## 2021-11-03 DIAGNOSIS — D499 Neoplasm of unspecified behavior of unspecified site: Secondary | ICD-10-CM | POA: Diagnosis not present

## 2021-11-03 DIAGNOSIS — J069 Acute upper respiratory infection, unspecified: Secondary | ICD-10-CM | POA: Diagnosis not present

## 2021-11-03 DIAGNOSIS — I10 Essential (primary) hypertension: Secondary | ICD-10-CM | POA: Diagnosis not present

## 2021-11-03 DIAGNOSIS — Z299 Encounter for prophylactic measures, unspecified: Secondary | ICD-10-CM | POA: Diagnosis not present

## 2022-02-09 DIAGNOSIS — Z20822 Contact with and (suspected) exposure to covid-19: Secondary | ICD-10-CM | POA: Diagnosis not present

## 2022-02-22 DIAGNOSIS — H01002 Unspecified blepharitis right lower eyelid: Secondary | ICD-10-CM | POA: Diagnosis not present

## 2022-02-22 DIAGNOSIS — Z961 Presence of intraocular lens: Secondary | ICD-10-CM | POA: Diagnosis not present

## 2022-02-22 DIAGNOSIS — H40013 Open angle with borderline findings, low risk, bilateral: Secondary | ICD-10-CM | POA: Diagnosis not present

## 2022-02-22 DIAGNOSIS — H01004 Unspecified blepharitis left upper eyelid: Secondary | ICD-10-CM | POA: Diagnosis not present

## 2022-02-22 DIAGNOSIS — H01005 Unspecified blepharitis left lower eyelid: Secondary | ICD-10-CM | POA: Diagnosis not present

## 2022-02-22 DIAGNOSIS — H35371 Puckering of macula, right eye: Secondary | ICD-10-CM | POA: Diagnosis not present

## 2022-02-22 DIAGNOSIS — H01001 Unspecified blepharitis right upper eyelid: Secondary | ICD-10-CM | POA: Diagnosis not present

## 2022-02-22 DIAGNOSIS — H02831 Dermatochalasis of right upper eyelid: Secondary | ICD-10-CM | POA: Diagnosis not present

## 2022-02-22 DIAGNOSIS — H11042 Peripheral pterygium, stationary, left eye: Secondary | ICD-10-CM | POA: Diagnosis not present

## 2022-02-22 DIAGNOSIS — H02834 Dermatochalasis of left upper eyelid: Secondary | ICD-10-CM | POA: Diagnosis not present

## 2022-02-24 DIAGNOSIS — Z20828 Contact with and (suspected) exposure to other viral communicable diseases: Secondary | ICD-10-CM | POA: Diagnosis not present

## 2022-02-28 DIAGNOSIS — Z20822 Contact with and (suspected) exposure to covid-19: Secondary | ICD-10-CM | POA: Diagnosis not present

## 2022-03-10 DIAGNOSIS — Z20828 Contact with and (suspected) exposure to other viral communicable diseases: Secondary | ICD-10-CM | POA: Diagnosis not present

## 2022-03-14 DIAGNOSIS — E782 Mixed hyperlipidemia: Secondary | ICD-10-CM | POA: Diagnosis not present

## 2022-03-14 DIAGNOSIS — I10 Essential (primary) hypertension: Secondary | ICD-10-CM | POA: Diagnosis not present

## 2022-03-14 DIAGNOSIS — E1169 Type 2 diabetes mellitus with other specified complication: Secondary | ICD-10-CM | POA: Diagnosis not present

## 2022-03-14 DIAGNOSIS — E7849 Other hyperlipidemia: Secondary | ICD-10-CM | POA: Diagnosis not present

## 2022-03-14 DIAGNOSIS — E1159 Type 2 diabetes mellitus with other circulatory complications: Secondary | ICD-10-CM | POA: Diagnosis not present

## 2022-03-14 DIAGNOSIS — K219 Gastro-esophageal reflux disease without esophagitis: Secondary | ICD-10-CM | POA: Diagnosis not present

## 2022-03-21 DIAGNOSIS — E1169 Type 2 diabetes mellitus with other specified complication: Secondary | ICD-10-CM | POA: Diagnosis not present

## 2022-03-21 DIAGNOSIS — E7849 Other hyperlipidemia: Secondary | ICD-10-CM | POA: Diagnosis not present

## 2022-03-21 DIAGNOSIS — I700 Atherosclerosis of aorta: Secondary | ICD-10-CM | POA: Diagnosis not present

## 2022-03-21 DIAGNOSIS — Z6827 Body mass index (BMI) 27.0-27.9, adult: Secondary | ICD-10-CM | POA: Diagnosis not present

## 2022-03-21 DIAGNOSIS — B356 Tinea cruris: Secondary | ICD-10-CM | POA: Diagnosis not present

## 2022-03-21 DIAGNOSIS — E876 Hypokalemia: Secondary | ICD-10-CM | POA: Diagnosis not present

## 2022-03-21 DIAGNOSIS — I10 Essential (primary) hypertension: Secondary | ICD-10-CM | POA: Diagnosis not present

## 2022-03-26 DIAGNOSIS — Z20822 Contact with and (suspected) exposure to covid-19: Secondary | ICD-10-CM | POA: Diagnosis not present

## 2022-04-10 IMAGING — MG MM DIGITAL SCREENING BILAT W/ TOMO AND CAD
8 series · 8 of 24 positions shown · non-contrast
Comparison: Previous exam(s).

CLINICAL DATA: Screening. History of LEFT breast excisional
surgery.

EXAM:
DIGITAL SCREENING BILATERAL MAMMOGRAM WITH TOMOSYNTHESIS AND CAD
TECHNIQUE: Bilateral screening digital craniocaudal and mediolateral oblique
mammograms were obtained. Bilateral screening digital breast
tomosynthesis was performed. The images were evaluated with
computer-aided detection.

[R CC synth-2D]
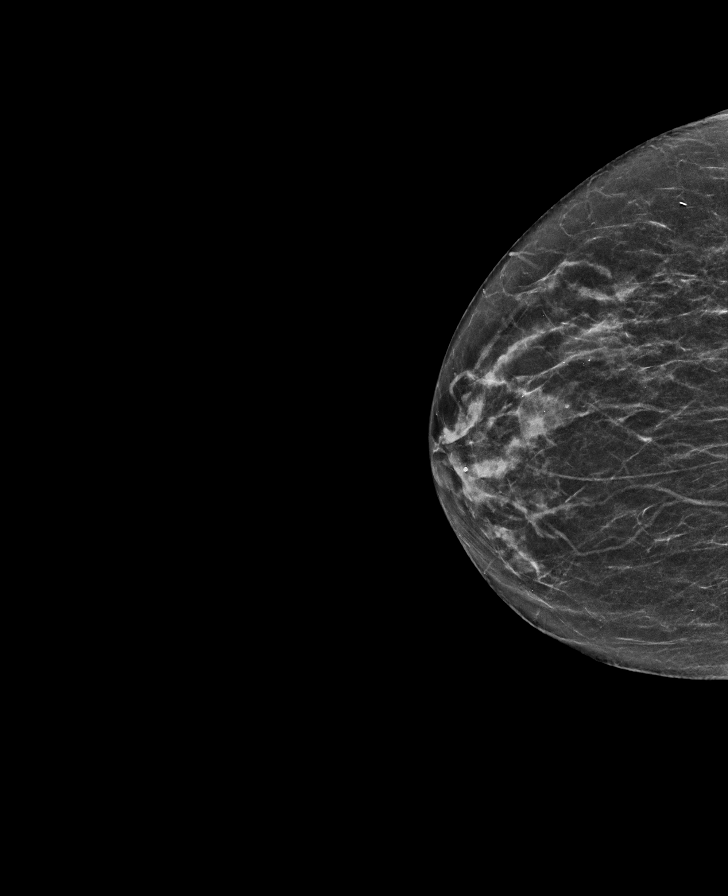

[L CC synth-2D]
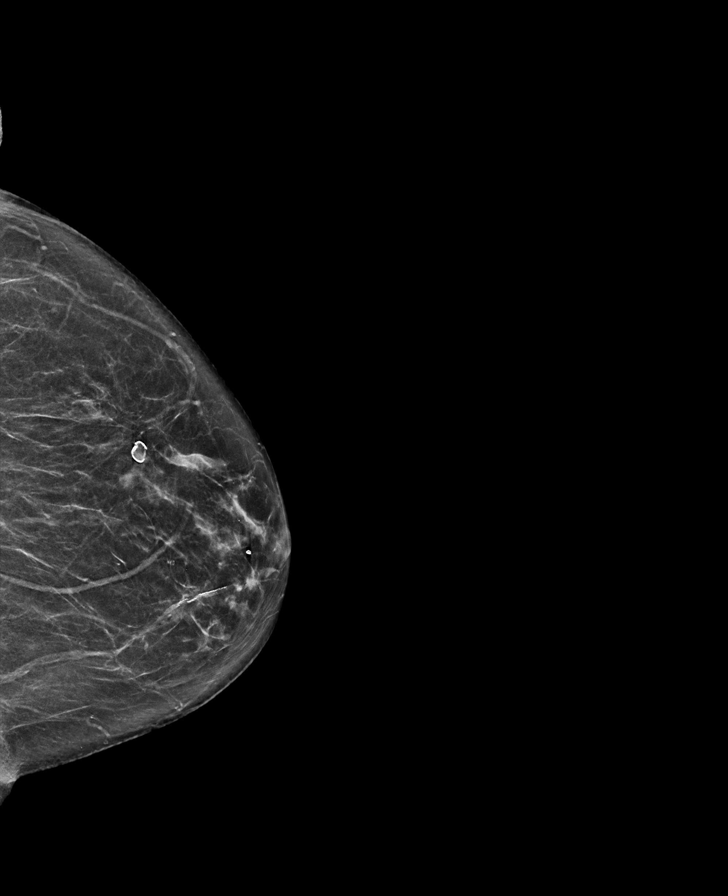

[R MLO synth-2D]
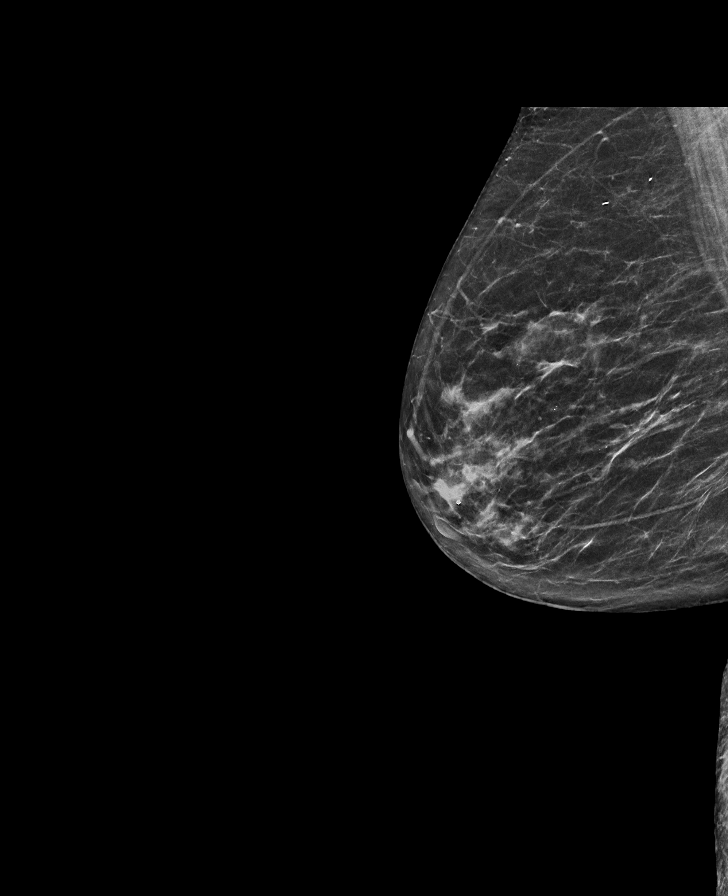

[L MLO synth-2D]
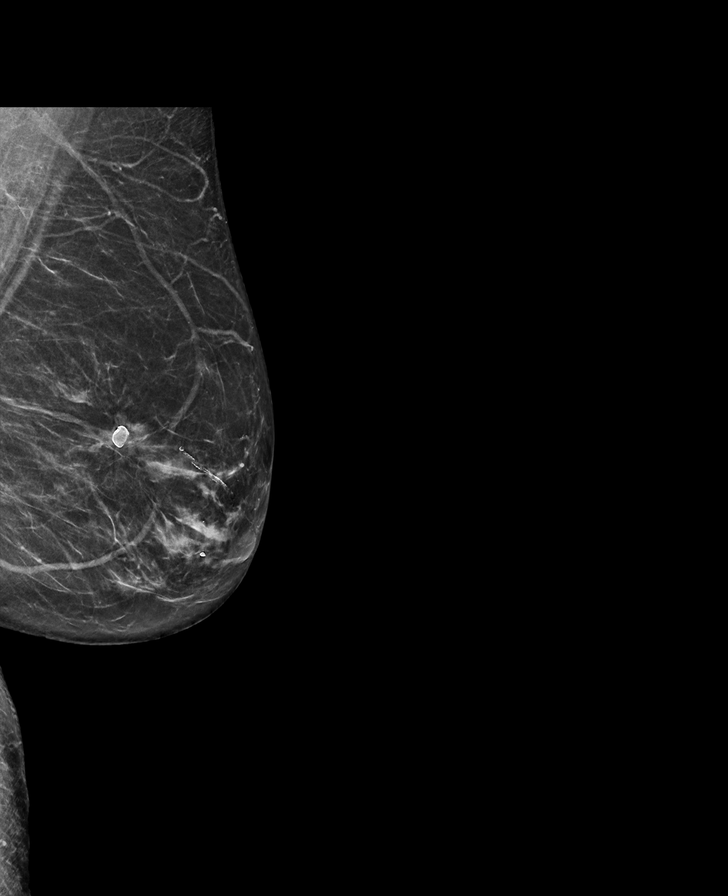

[R MLO tomo · tomo slice 31/60.0]
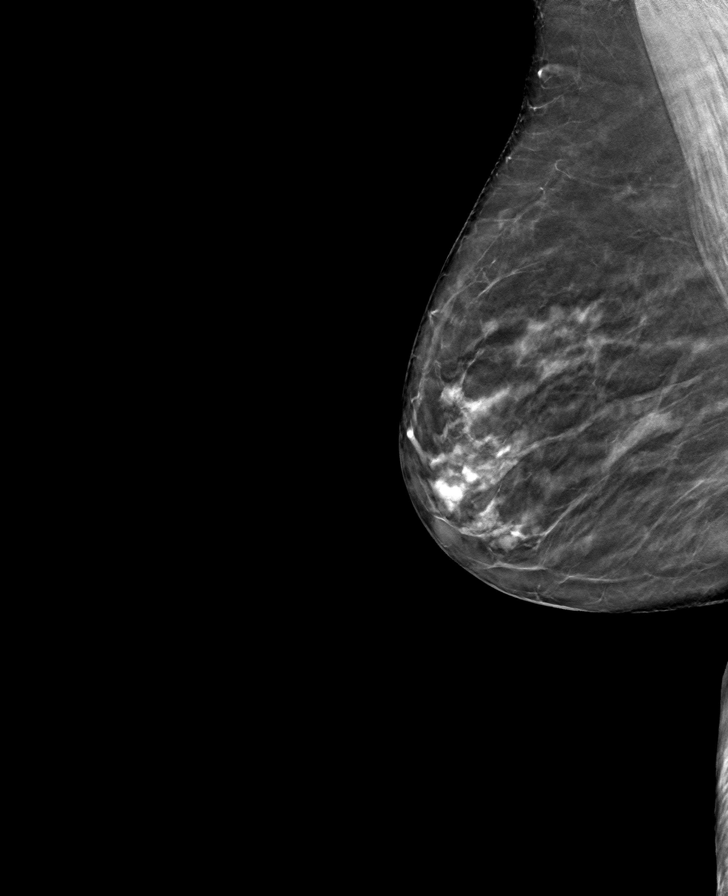

[L CC tomo · tomo slice 31/60.0]
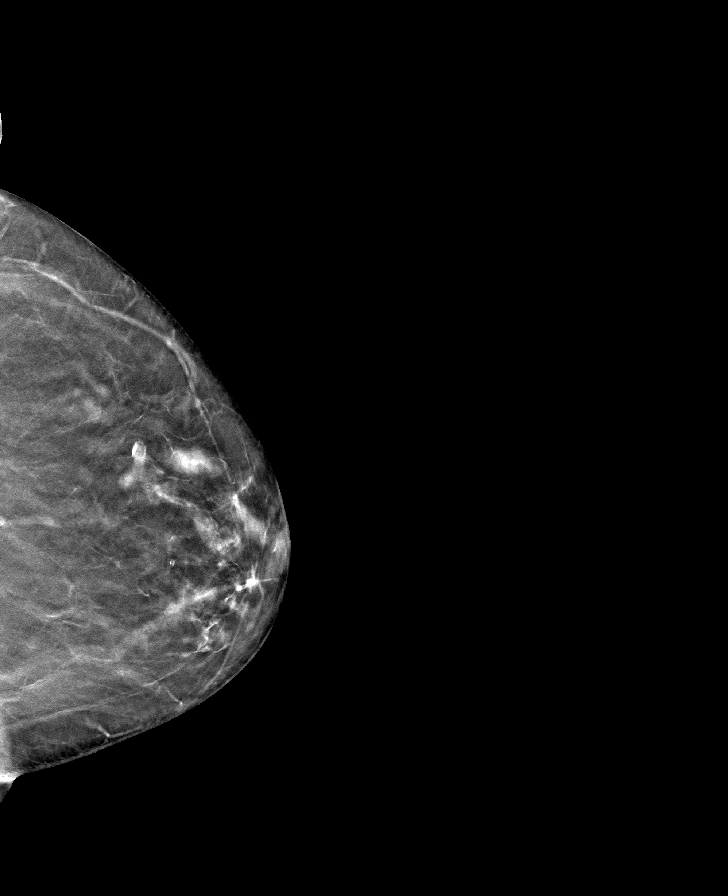

[R CC tomo · tomo slice 27/52.0]
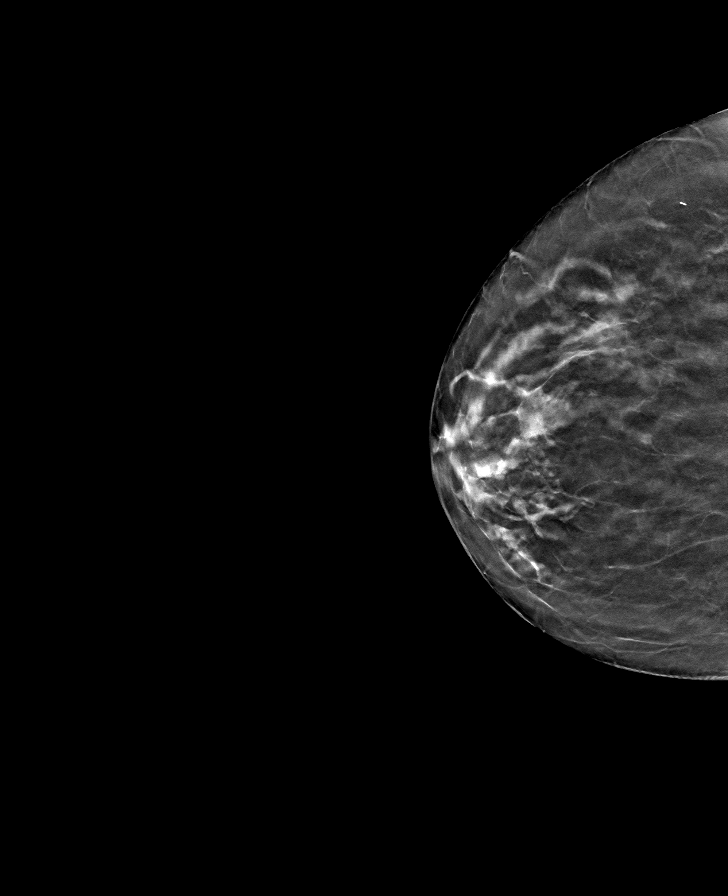

[L MLO tomo · tomo slice 31/61.0]
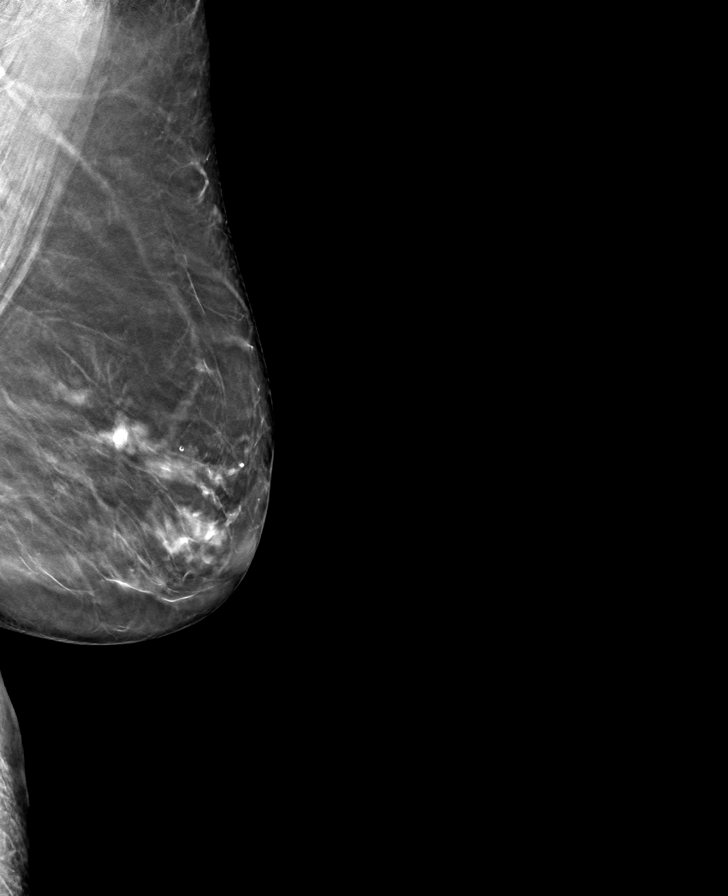

[8 of 24 positions shown; findings below may reference images not displayed]

ACR Breast Density Category b: There are scattered areas of
fibroglandular density.
FINDINGS: There are no findings suspicious for malignancy. LEFT breast
surgical changes are again noted.
IMPRESSION: No mammographic evidence of malignancy. A result letter of this
screening mammogram will be mailed directly to the patient.

RECOMMENDATION:
Screening mammogram in one year. (Code:KF-[id number])

BI-RADS CATEGORY  2: Benign.

## 2022-04-12 DIAGNOSIS — Z20822 Contact with and (suspected) exposure to covid-19: Secondary | ICD-10-CM | POA: Diagnosis not present

## 2022-04-16 DIAGNOSIS — Z20822 Contact with and (suspected) exposure to covid-19: Secondary | ICD-10-CM | POA: Diagnosis not present

## 2022-06-02 DIAGNOSIS — J209 Acute bronchitis, unspecified: Secondary | ICD-10-CM | POA: Diagnosis not present

## 2022-06-02 DIAGNOSIS — J309 Allergic rhinitis, unspecified: Secondary | ICD-10-CM | POA: Diagnosis not present

## 2022-06-02 DIAGNOSIS — R030 Elevated blood-pressure reading, without diagnosis of hypertension: Secondary | ICD-10-CM | POA: Diagnosis not present

## 2022-07-02 DIAGNOSIS — Z7984 Long term (current) use of oral hypoglycemic drugs: Secondary | ICD-10-CM | POA: Diagnosis not present

## 2022-07-02 DIAGNOSIS — H40013 Open angle with borderline findings, low risk, bilateral: Secondary | ICD-10-CM | POA: Diagnosis not present

## 2022-07-02 DIAGNOSIS — Z961 Presence of intraocular lens: Secondary | ICD-10-CM | POA: Diagnosis not present

## 2022-07-02 DIAGNOSIS — E119 Type 2 diabetes mellitus without complications: Secondary | ICD-10-CM | POA: Diagnosis not present

## 2022-09-06 DIAGNOSIS — H40013 Open angle with borderline findings, low risk, bilateral: Secondary | ICD-10-CM | POA: Diagnosis not present

## 2022-09-06 DIAGNOSIS — H11042 Peripheral pterygium, stationary, left eye: Secondary | ICD-10-CM | POA: Diagnosis not present

## 2022-09-06 DIAGNOSIS — H35371 Puckering of macula, right eye: Secondary | ICD-10-CM | POA: Diagnosis not present

## 2022-09-21 DIAGNOSIS — E039 Hypothyroidism, unspecified: Secondary | ICD-10-CM | POA: Diagnosis not present

## 2022-09-21 DIAGNOSIS — E785 Hyperlipidemia, unspecified: Secondary | ICD-10-CM | POA: Diagnosis not present

## 2022-09-21 DIAGNOSIS — K219 Gastro-esophageal reflux disease without esophagitis: Secondary | ICD-10-CM | POA: Diagnosis not present

## 2022-09-21 DIAGNOSIS — E7849 Other hyperlipidemia: Secondary | ICD-10-CM | POA: Diagnosis not present

## 2022-09-21 DIAGNOSIS — E876 Hypokalemia: Secondary | ICD-10-CM | POA: Diagnosis not present

## 2022-09-21 DIAGNOSIS — I10 Essential (primary) hypertension: Secondary | ICD-10-CM | POA: Diagnosis not present

## 2022-09-21 DIAGNOSIS — E559 Vitamin D deficiency, unspecified: Secondary | ICD-10-CM | POA: Diagnosis not present

## 2022-09-21 DIAGNOSIS — E1159 Type 2 diabetes mellitus with other circulatory complications: Secondary | ICD-10-CM | POA: Diagnosis not present

## 2022-09-25 DIAGNOSIS — K591 Functional diarrhea: Secondary | ICD-10-CM | POA: Diagnosis not present

## 2022-09-25 DIAGNOSIS — E7849 Other hyperlipidemia: Secondary | ICD-10-CM | POA: Diagnosis not present

## 2022-09-25 DIAGNOSIS — E876 Hypokalemia: Secondary | ICD-10-CM | POA: Diagnosis not present

## 2022-09-25 DIAGNOSIS — Z0001 Encounter for general adult medical examination with abnormal findings: Secondary | ICD-10-CM | POA: Diagnosis not present

## 2022-09-25 DIAGNOSIS — I700 Atherosclerosis of aorta: Secondary | ICD-10-CM | POA: Diagnosis not present

## 2022-09-25 DIAGNOSIS — D126 Benign neoplasm of colon, unspecified: Secondary | ICD-10-CM | POA: Diagnosis not present

## 2022-09-25 DIAGNOSIS — Z23 Encounter for immunization: Secondary | ICD-10-CM | POA: Diagnosis not present

## 2022-09-25 DIAGNOSIS — I10 Essential (primary) hypertension: Secondary | ICD-10-CM | POA: Diagnosis not present

## 2022-09-25 DIAGNOSIS — E1169 Type 2 diabetes mellitus with other specified complication: Secondary | ICD-10-CM | POA: Diagnosis not present

## 2022-09-25 DIAGNOSIS — Z6826 Body mass index (BMI) 26.0-26.9, adult: Secondary | ICD-10-CM | POA: Diagnosis not present

## 2022-10-04 ENCOUNTER — Ambulatory Visit: Payer: Medicare Other

## 2022-10-04 ENCOUNTER — Other Ambulatory Visit: Payer: Self-pay | Admitting: Family Medicine

## 2022-10-04 DIAGNOSIS — Z1231 Encounter for screening mammogram for malignant neoplasm of breast: Secondary | ICD-10-CM

## 2022-10-16 ENCOUNTER — Ambulatory Visit
Admission: RE | Admit: 2022-10-16 | Discharge: 2022-10-16 | Disposition: A | Discharge status: Home / Self Care | Payer: Medicare Other | Source: Ambulatory Visit | Attending: Family Medicine | Admitting: Family Medicine

## 2022-10-16 DIAGNOSIS — Z1231 Encounter for screening mammogram for malignant neoplasm of breast: Secondary | ICD-10-CM | POA: Diagnosis not present

## 2022-11-27 ENCOUNTER — Ambulatory Visit: Payer: Medicare Other

## 2023-03-21 DIAGNOSIS — H40013 Open angle with borderline findings, low risk, bilateral: Secondary | ICD-10-CM | POA: Diagnosis not present

## 2023-03-21 DIAGNOSIS — H35371 Puckering of macula, right eye: Secondary | ICD-10-CM | POA: Diagnosis not present

## 2023-03-28 DIAGNOSIS — K5730 Diverticulosis of large intestine without perforation or abscess without bleeding: Secondary | ICD-10-CM | POA: Diagnosis not present

## 2023-03-28 DIAGNOSIS — E782 Mixed hyperlipidemia: Secondary | ICD-10-CM | POA: Diagnosis not present

## 2023-03-28 DIAGNOSIS — E7849 Other hyperlipidemia: Secondary | ICD-10-CM | POA: Diagnosis not present

## 2023-03-28 DIAGNOSIS — E1169 Type 2 diabetes mellitus with other specified complication: Secondary | ICD-10-CM | POA: Diagnosis not present

## 2023-03-28 DIAGNOSIS — E785 Hyperlipidemia, unspecified: Secondary | ICD-10-CM | POA: Diagnosis not present

## 2023-03-28 DIAGNOSIS — I10 Essential (primary) hypertension: Secondary | ICD-10-CM | POA: Diagnosis not present

## 2023-03-28 DIAGNOSIS — E1159 Type 2 diabetes mellitus with other circulatory complications: Secondary | ICD-10-CM | POA: Diagnosis not present

## 2023-04-04 DIAGNOSIS — E876 Hypokalemia: Secondary | ICD-10-CM | POA: Diagnosis not present

## 2023-04-04 DIAGNOSIS — E1169 Type 2 diabetes mellitus with other specified complication: Secondary | ICD-10-CM | POA: Diagnosis not present

## 2023-04-04 DIAGNOSIS — E7849 Other hyperlipidemia: Secondary | ICD-10-CM | POA: Diagnosis not present

## 2023-04-04 DIAGNOSIS — I10 Essential (primary) hypertension: Secondary | ICD-10-CM | POA: Diagnosis not present

## 2023-04-04 DIAGNOSIS — Z6826 Body mass index (BMI) 26.0-26.9, adult: Secondary | ICD-10-CM | POA: Diagnosis not present

## 2023-04-04 DIAGNOSIS — I700 Atherosclerosis of aorta: Secondary | ICD-10-CM | POA: Diagnosis not present

## 2023-04-04 DIAGNOSIS — D126 Benign neoplasm of colon, unspecified: Secondary | ICD-10-CM | POA: Diagnosis not present

## 2023-04-30 DIAGNOSIS — H11042 Peripheral pterygium, stationary, left eye: Secondary | ICD-10-CM | POA: Diagnosis not present

## 2023-07-16 DIAGNOSIS — H11042 Peripheral pterygium, stationary, left eye: Secondary | ICD-10-CM | POA: Diagnosis not present

## 2023-08-29 DIAGNOSIS — H179 Unspecified corneal scar and opacity: Secondary | ICD-10-CM | POA: Diagnosis not present

## 2023-09-26 DIAGNOSIS — Z23 Encounter for immunization: Secondary | ICD-10-CM | POA: Diagnosis not present

## 2023-10-04 DIAGNOSIS — E785 Hyperlipidemia, unspecified: Secondary | ICD-10-CM | POA: Diagnosis not present

## 2023-10-04 DIAGNOSIS — Z1329 Encounter for screening for other suspected endocrine disorder: Secondary | ICD-10-CM | POA: Diagnosis not present

## 2023-10-04 DIAGNOSIS — E1159 Type 2 diabetes mellitus with other circulatory complications: Secondary | ICD-10-CM | POA: Diagnosis not present

## 2023-10-04 DIAGNOSIS — E7849 Other hyperlipidemia: Secondary | ICD-10-CM | POA: Diagnosis not present

## 2023-10-04 DIAGNOSIS — I10 Essential (primary) hypertension: Secondary | ICD-10-CM | POA: Diagnosis not present

## 2023-10-04 DIAGNOSIS — E1169 Type 2 diabetes mellitus with other specified complication: Secondary | ICD-10-CM | POA: Diagnosis not present

## 2023-10-11 DIAGNOSIS — I10 Essential (primary) hypertension: Secondary | ICD-10-CM | POA: Diagnosis not present

## 2023-10-11 DIAGNOSIS — D126 Benign neoplasm of colon, unspecified: Secondary | ICD-10-CM | POA: Diagnosis not present

## 2023-10-11 DIAGNOSIS — I700 Atherosclerosis of aorta: Secondary | ICD-10-CM | POA: Diagnosis not present

## 2023-10-11 DIAGNOSIS — Z1331 Encounter for screening for depression: Secondary | ICD-10-CM | POA: Diagnosis not present

## 2023-10-11 DIAGNOSIS — Z23 Encounter for immunization: Secondary | ICD-10-CM | POA: Diagnosis not present

## 2023-10-11 DIAGNOSIS — Z6825 Body mass index (BMI) 25.0-25.9, adult: Secondary | ICD-10-CM | POA: Diagnosis not present

## 2023-10-11 DIAGNOSIS — Z0001 Encounter for general adult medical examination with abnormal findings: Secondary | ICD-10-CM | POA: Diagnosis not present

## 2023-10-11 DIAGNOSIS — E7849 Other hyperlipidemia: Secondary | ICD-10-CM | POA: Diagnosis not present

## 2023-10-11 DIAGNOSIS — E1169 Type 2 diabetes mellitus with other specified complication: Secondary | ICD-10-CM | POA: Diagnosis not present

## 2023-10-11 DIAGNOSIS — Z1389 Encounter for screening for other disorder: Secondary | ICD-10-CM | POA: Diagnosis not present

## 2023-10-24 DIAGNOSIS — Z6825 Body mass index (BMI) 25.0-25.9, adult: Secondary | ICD-10-CM | POA: Diagnosis not present

## 2023-10-24 DIAGNOSIS — R030 Elevated blood-pressure reading, without diagnosis of hypertension: Secondary | ICD-10-CM | POA: Diagnosis not present

## 2023-10-24 DIAGNOSIS — J329 Chronic sinusitis, unspecified: Secondary | ICD-10-CM | POA: Diagnosis not present

## 2023-10-24 DIAGNOSIS — J40 Bronchitis, not specified as acute or chronic: Secondary | ICD-10-CM | POA: Diagnosis not present

## 2023-12-31 DIAGNOSIS — H179 Unspecified corneal scar and opacity: Secondary | ICD-10-CM | POA: Diagnosis not present

## 2023-12-31 DIAGNOSIS — H11042 Peripheral pterygium, stationary, left eye: Secondary | ICD-10-CM | POA: Diagnosis not present

## 2024-02-12 DIAGNOSIS — J0100 Acute maxillary sinusitis, unspecified: Secondary | ICD-10-CM | POA: Diagnosis not present

## 2024-02-12 DIAGNOSIS — H6692 Otitis media, unspecified, left ear: Secondary | ICD-10-CM | POA: Diagnosis not present

## 2024-02-12 DIAGNOSIS — Z299 Encounter for prophylactic measures, unspecified: Secondary | ICD-10-CM | POA: Diagnosis not present

## 2024-02-12 DIAGNOSIS — R0981 Nasal congestion: Secondary | ICD-10-CM | POA: Diagnosis not present

## 2024-02-12 DIAGNOSIS — H9209 Otalgia, unspecified ear: Secondary | ICD-10-CM | POA: Diagnosis not present

## 2024-03-14 DIAGNOSIS — R300 Dysuria: Secondary | ICD-10-CM | POA: Diagnosis not present

## 2024-03-14 DIAGNOSIS — Z6824 Body mass index (BMI) 24.0-24.9, adult: Secondary | ICD-10-CM | POA: Diagnosis not present

## 2024-03-14 DIAGNOSIS — R350 Frequency of micturition: Secondary | ICD-10-CM | POA: Diagnosis not present

## 2024-03-14 DIAGNOSIS — H9202 Otalgia, left ear: Secondary | ICD-10-CM | POA: Diagnosis not present

## 2024-04-08 DIAGNOSIS — E785 Hyperlipidemia, unspecified: Secondary | ICD-10-CM | POA: Diagnosis not present

## 2024-04-08 DIAGNOSIS — I10 Essential (primary) hypertension: Secondary | ICD-10-CM | POA: Diagnosis not present

## 2024-04-08 DIAGNOSIS — E119 Type 2 diabetes mellitus without complications: Secondary | ICD-10-CM | POA: Diagnosis not present

## 2024-04-08 DIAGNOSIS — E876 Hypokalemia: Secondary | ICD-10-CM | POA: Diagnosis not present

## 2024-04-14 DIAGNOSIS — I10 Essential (primary) hypertension: Secondary | ICD-10-CM | POA: Diagnosis not present

## 2024-04-14 DIAGNOSIS — E1169 Type 2 diabetes mellitus with other specified complication: Secondary | ICD-10-CM | POA: Diagnosis not present

## 2024-04-14 DIAGNOSIS — E782 Mixed hyperlipidemia: Secondary | ICD-10-CM | POA: Diagnosis not present

## 2024-04-14 DIAGNOSIS — E119 Type 2 diabetes mellitus without complications: Secondary | ICD-10-CM | POA: Diagnosis not present

## 2024-04-14 DIAGNOSIS — Z6824 Body mass index (BMI) 24.0-24.9, adult: Secondary | ICD-10-CM | POA: Diagnosis not present

## 2024-04-14 DIAGNOSIS — H6992 Unspecified Eustachian tube disorder, left ear: Secondary | ICD-10-CM | POA: Diagnosis not present

## 2024-07-28 DIAGNOSIS — Z1231 Encounter for screening mammogram for malignant neoplasm of breast: Secondary | ICD-10-CM | POA: Diagnosis not present

## 2024-08-05 DIAGNOSIS — H179 Unspecified corneal scar and opacity: Secondary | ICD-10-CM | POA: Diagnosis not present

## 2024-08-05 DIAGNOSIS — H5203 Hypermetropia, bilateral: Secondary | ICD-10-CM | POA: Diagnosis not present

## 2024-08-05 DIAGNOSIS — H35371 Puckering of macula, right eye: Secondary | ICD-10-CM | POA: Diagnosis not present

## 2024-08-05 DIAGNOSIS — H40013 Open angle with borderline findings, low risk, bilateral: Secondary | ICD-10-CM | POA: Diagnosis not present

## 2024-08-05 DIAGNOSIS — H11042 Peripheral pterygium, stationary, left eye: Secondary | ICD-10-CM | POA: Diagnosis not present

## 2024-10-13 DIAGNOSIS — E1169 Type 2 diabetes mellitus with other specified complication: Secondary | ICD-10-CM | POA: Diagnosis not present

## 2024-10-13 DIAGNOSIS — I10 Essential (primary) hypertension: Secondary | ICD-10-CM | POA: Diagnosis not present

## 2024-10-13 DIAGNOSIS — E559 Vitamin D deficiency, unspecified: Secondary | ICD-10-CM | POA: Diagnosis not present

## 2024-10-13 DIAGNOSIS — E785 Hyperlipidemia, unspecified: Secondary | ICD-10-CM | POA: Diagnosis not present

## 2024-10-13 DIAGNOSIS — Z0001 Encounter for general adult medical examination with abnormal findings: Secondary | ICD-10-CM | POA: Diagnosis not present

## 2024-10-13 DIAGNOSIS — E876 Hypokalemia: Secondary | ICD-10-CM | POA: Diagnosis not present

## 2024-10-13 DIAGNOSIS — Z1329 Encounter for screening for other suspected endocrine disorder: Secondary | ICD-10-CM | POA: Diagnosis not present

## 2024-10-20 DIAGNOSIS — Z6823 Body mass index (BMI) 23.0-23.9, adult: Secondary | ICD-10-CM | POA: Diagnosis not present

## 2024-10-20 DIAGNOSIS — K219 Gastro-esophageal reflux disease without esophagitis: Secondary | ICD-10-CM | POA: Diagnosis not present

## 2024-10-20 DIAGNOSIS — I10 Essential (primary) hypertension: Secondary | ICD-10-CM | POA: Diagnosis not present

## 2024-10-20 DIAGNOSIS — E1169 Type 2 diabetes mellitus with other specified complication: Secondary | ICD-10-CM | POA: Diagnosis not present

## 2024-10-20 DIAGNOSIS — D126 Benign neoplasm of colon, unspecified: Secondary | ICD-10-CM | POA: Diagnosis not present

## 2024-10-20 DIAGNOSIS — Z0001 Encounter for general adult medical examination with abnormal findings: Secondary | ICD-10-CM | POA: Diagnosis not present

## 2024-10-20 DIAGNOSIS — I700 Atherosclerosis of aorta: Secondary | ICD-10-CM | POA: Diagnosis not present
